# Patient Record
Sex: Female | Born: 1935 | Race: White | Hispanic: No | Smoking: Current every day smoker
Health system: Southern US, Community
[De-identification: ages and names within clinical notes are randomized; demographics above are authoritative.]

## PROBLEM LIST (undated history)

## (undated) DIAGNOSIS — F329 Major depressive disorder, single episode, unspecified: Secondary | ICD-10-CM

## (undated) DIAGNOSIS — F32A Depression, unspecified: Secondary | ICD-10-CM

## (undated) DIAGNOSIS — F419 Anxiety disorder, unspecified: Secondary | ICD-10-CM

## (undated) DIAGNOSIS — J449 Chronic obstructive pulmonary disease, unspecified: Secondary | ICD-10-CM

## (undated) DIAGNOSIS — I10 Essential (primary) hypertension: Secondary | ICD-10-CM

---

## 2013-11-05 ENCOUNTER — Inpatient Hospital Stay (HOSPITAL_COMMUNITY): Payer: Medicare Other

## 2013-11-05 ENCOUNTER — Encounter (HOSPITAL_COMMUNITY): Payer: Self-pay | Admitting: *Deleted

## 2013-11-05 ENCOUNTER — Inpatient Hospital Stay (HOSPITAL_COMMUNITY)
Admission: AD | Admit: 2013-11-05 | Discharge: 2013-11-09 | DRG: 470 | Disposition: A | Discharge status: Skilled Nursing Facility | Payer: Medicare Other | Source: Other Acute Inpatient Hospital | Attending: Internal Medicine | Admitting: Internal Medicine

## 2013-11-05 DIAGNOSIS — S72143A Displaced intertrochanteric fracture of unspecified femur, initial encounter for closed fracture: Principal | ICD-10-CM | POA: Diagnosis present

## 2013-11-05 DIAGNOSIS — F411 Generalized anxiety disorder: Secondary | ICD-10-CM

## 2013-11-05 DIAGNOSIS — S72001A Fracture of unspecified part of neck of right femur, initial encounter for closed fracture: Secondary | ICD-10-CM | POA: Diagnosis present

## 2013-11-05 DIAGNOSIS — I10 Essential (primary) hypertension: Secondary | ICD-10-CM

## 2013-11-05 DIAGNOSIS — R911 Solitary pulmonary nodule: Secondary | ICD-10-CM | POA: Diagnosis present

## 2013-11-05 DIAGNOSIS — F17200 Nicotine dependence, unspecified, uncomplicated: Secondary | ICD-10-CM | POA: Diagnosis present

## 2013-11-05 DIAGNOSIS — D62 Acute posthemorrhagic anemia: Secondary | ICD-10-CM | POA: Diagnosis not present

## 2013-11-05 DIAGNOSIS — F32A Depression, unspecified: Secondary | ICD-10-CM

## 2013-11-05 DIAGNOSIS — R5082 Postprocedural fever: Secondary | ICD-10-CM | POA: Diagnosis present

## 2013-11-05 DIAGNOSIS — W19XXXA Unspecified fall, initial encounter: Secondary | ICD-10-CM | POA: Diagnosis present

## 2013-11-05 DIAGNOSIS — Y92009 Unspecified place in unspecified non-institutional (private) residence as the place of occurrence of the external cause: Secondary | ICD-10-CM

## 2013-11-05 DIAGNOSIS — F329 Major depressive disorder, single episode, unspecified: Secondary | ICD-10-CM

## 2013-11-05 DIAGNOSIS — F3289 Other specified depressive episodes: Secondary | ICD-10-CM

## 2013-11-05 DIAGNOSIS — E039 Hypothyroidism, unspecified: Secondary | ICD-10-CM

## 2013-11-05 DIAGNOSIS — J449 Chronic obstructive pulmonary disease, unspecified: Secondary | ICD-10-CM | POA: Diagnosis present

## 2013-11-05 DIAGNOSIS — S72009A Fracture of unspecified part of neck of unspecified femur, initial encounter for closed fracture: Secondary | ICD-10-CM | POA: Diagnosis present

## 2013-11-05 DIAGNOSIS — D696 Thrombocytopenia, unspecified: Secondary | ICD-10-CM | POA: Diagnosis present

## 2013-11-05 DIAGNOSIS — J4489 Other specified chronic obstructive pulmonary disease: Secondary | ICD-10-CM | POA: Diagnosis present

## 2013-11-05 DIAGNOSIS — S72141A Displaced intertrochanteric fracture of right femur, initial encounter for closed fracture: Secondary | ICD-10-CM

## 2013-11-05 DIAGNOSIS — J441 Chronic obstructive pulmonary disease with (acute) exacerbation: Secondary | ICD-10-CM

## 2013-11-05 HISTORY — DX: Essential (primary) hypertension: I10

## 2013-11-05 HISTORY — DX: Anxiety disorder, unspecified: F41.9

## 2013-11-05 HISTORY — DX: Chronic obstructive pulmonary disease, unspecified: J44.9

## 2013-11-05 HISTORY — DX: Depression, unspecified: F32.A

## 2013-11-05 HISTORY — DX: Major depressive disorder, single episode, unspecified: F32.9

## 2013-11-05 LAB — COMPREHENSIVE METABOLIC PANEL
ALBUMIN: 3.7 g/dL (ref 3.5–5.2)
ALT: 24 U/L (ref 0–35)
AST: 40 U/L — AB (ref 0–37)
Alkaline Phosphatase: 97 U/L (ref 39–117)
BILIRUBIN TOTAL: 0.5 mg/dL (ref 0.3–1.2)
BUN: 8 mg/dL (ref 6–23)
CHLORIDE: 102 meq/L (ref 96–112)
CO2: 26 mEq/L (ref 19–32)
Calcium: 9.2 mg/dL (ref 8.4–10.5)
Creatinine, Ser: 1.05 mg/dL (ref 0.50–1.10)
GFR calc Af Amer: 56 mL/min — ABNORMAL LOW (ref >90)
GFR calc non Af Amer: 48 mL/min — ABNORMAL LOW (ref >90)
Glucose, Bld: 117 mg/dL — ABNORMAL HIGH (ref 70–99)
POTASSIUM: 4.1 meq/L (ref 3.7–5.3)
Sodium: 140 mEq/L (ref 137–147)
Total Protein: 6.7 g/dL (ref 6.0–8.3)

## 2013-11-05 LAB — CBC WITH DIFFERENTIAL/PLATELET
BASOS ABS: 0 10*3/uL (ref 0.0–0.1)
Basophils Relative: 0 % (ref 0–1)
Eosinophils Absolute: 0 10*3/uL (ref 0.0–0.7)
Eosinophils Relative: 0 % (ref 0–5)
HEMATOCRIT: 43 % (ref 36.0–46.0)
Hemoglobin: 14.2 g/dL (ref 12.0–15.0)
Lymphocytes Relative: 11 % — ABNORMAL LOW (ref 12–46)
Lymphs Abs: 0.6 10*3/uL — ABNORMAL LOW (ref 0.7–4.0)
MCH: 30.4 pg (ref 26.0–34.0)
MCHC: 33 g/dL (ref 30.0–36.0)
MCV: 92.1 fL (ref 78.0–100.0)
Monocytes Absolute: 0.1 10*3/uL (ref 0.1–1.0)
Monocytes Relative: 3 % (ref 3–12)
NEUTROS ABS: 4.4 10*3/uL (ref 1.7–7.7)
Neutrophils Relative %: 86 % — ABNORMAL HIGH (ref 43–77)
PLATELETS: 120 10*3/uL — AB (ref 150–400)
RBC: 4.67 MIL/uL (ref 3.87–5.11)
RDW: 14.2 % (ref 11.5–15.5)
WBC: 5.1 10*3/uL (ref 4.0–10.5)

## 2013-11-05 LAB — APTT: APTT: 30 s (ref 24–37)

## 2013-11-05 LAB — BLOOD GAS, ARTERIAL
Acid-Base Excess: 1.5 mmol/L (ref 0.0–2.0)
Bicarbonate: 25.5 mEq/L — ABNORMAL HIGH (ref 20.0–24.0)
Drawn by: 105521
FIO2: 0.21 %
O2 Saturation: 92.5 %
PATIENT TEMPERATURE: 98.6
PH ART: 7.424 (ref 7.350–7.450)
TCO2: 26.7 mmol/L (ref 0–100)
pCO2 arterial: 39.6 mmHg (ref 35.0–45.0)
pO2, Arterial: 62.1 mmHg — ABNORMAL LOW (ref 80.0–100.0)

## 2013-11-05 LAB — PHOSPHORUS: PHOSPHORUS: 2.5 mg/dL (ref 2.3–4.6)

## 2013-11-05 LAB — TSH: TSH: 0.934 u[IU]/mL (ref 0.350–4.500)

## 2013-11-05 LAB — MAGNESIUM: Magnesium: 1.8 mg/dL (ref 1.5–2.5)

## 2013-11-05 LAB — PROTIME-INR
INR: 0.94 (ref 0.00–1.49)
PROTHROMBIN TIME: 12.4 s (ref 11.6–15.2)

## 2013-11-05 MED ORDER — FOLIC ACID 1 MG PO TABS
1.0000 mg | ORAL_TABLET | Freq: Every day | ORAL | Status: DC
  Administered 2013-11-07 – 2013-11-09 (×3): 1 mg via ORAL
  Filled 2013-11-05 (×5): qty 1

## 2013-11-05 MED ORDER — ONDANSETRON HCL 4 MG PO TABS: 4.0000 mg | ORAL_TABLET | Freq: Four times a day (QID) | ORAL | Status: DC | PRN

## 2013-11-05 MED ORDER — HYDROCODONE-ACETAMINOPHEN 5-325 MG PO TABS
1.0000 | ORAL_TABLET | ORAL | Status: DC | PRN
  Administered 2013-11-05 – 2013-11-06 (×4): 1 via ORAL
  Administered 2013-11-06: 2 via ORAL
  Administered 2013-11-06: 1 via ORAL
  Administered 2013-11-07 (×2): 2 via ORAL
  Administered 2013-11-07: 1 via ORAL
  Administered 2013-11-07: 2 via ORAL
  Administered 2013-11-08: 1 via ORAL
  Filled 2013-11-05: qty 2
  Filled 2013-11-05: qty 1
  Filled 2013-11-05: qty 2
  Filled 2013-11-05 (×6): qty 1
  Filled 2013-11-05: qty 2

## 2013-11-05 MED ORDER — SODIUM CHLORIDE 0.9 % IV SOLN: 250.0000 mL | INTRAVENOUS | Status: DC | PRN

## 2013-11-05 MED ORDER — ACETAMINOPHEN 650 MG RE SUPP: 650.0000 mg | Freq: Four times a day (QID) | RECTAL | Status: DC | PRN

## 2013-11-05 MED ORDER — MORPHINE SULFATE 2 MG/ML IJ SOLN
2.0000 mg | INTRAMUSCULAR | Status: DC | PRN
  Administered 2013-11-06 – 2013-11-07 (×2): 2 mg via INTRAVENOUS
  Filled 2013-11-05 (×2): qty 1

## 2013-11-05 MED ORDER — LEVOTHYROXINE SODIUM 88 MCG PO TABS
88.0000 ug | ORAL_TABLET | Freq: Every day | ORAL | Status: DC
  Administered 2013-11-07 – 2013-11-09 (×3): 88 ug via ORAL
  Filled 2013-11-05 (×5): qty 1

## 2013-11-05 MED ORDER — ADULT MULTIVITAMIN W/MINERALS CH
1.0000 | ORAL_TABLET | Freq: Every day | ORAL | Status: DC
  Administered 2013-11-07 – 2013-11-09 (×3): 1 via ORAL
  Filled 2013-11-05 (×5): qty 1

## 2013-11-05 MED ORDER — ACETAMINOPHEN 325 MG PO TABS
650.0000 mg | ORAL_TABLET | Freq: Four times a day (QID) | ORAL | Status: DC | PRN
  Administered 2013-11-08: 650 mg via ORAL
  Filled 2013-11-05: qty 2

## 2013-11-05 MED ORDER — SODIUM CHLORIDE 0.9 % IJ SOLN: 3.0000 mL | INTRAMUSCULAR | Status: DC | PRN

## 2013-11-05 MED ORDER — SODIUM CHLORIDE 0.9 % IJ SOLN
3.0000 mL | Freq: Two times a day (BID) | INTRAMUSCULAR | Status: DC
  Administered 2013-11-06: 3 mL via INTRAVENOUS

## 2013-11-05 MED ORDER — SERTRALINE HCL 50 MG PO TABS
150.0000 mg | ORAL_TABLET | Freq: Every day | ORAL | Status: DC
  Administered 2013-11-07 – 2013-11-09 (×3): 150 mg via ORAL
  Filled 2013-11-05 (×4): qty 1

## 2013-11-05 MED ORDER — ATORVASTATIN CALCIUM 80 MG PO TABS
80.0000 mg | ORAL_TABLET | Freq: Every day | ORAL | Status: DC
  Administered 2013-11-05 – 2013-11-08 (×4): 80 mg via ORAL
  Filled 2013-11-05 (×5): qty 1

## 2013-11-05 MED ORDER — ONDANSETRON HCL 4 MG/2ML IJ SOLN
4.0000 mg | Freq: Four times a day (QID) | INTRAMUSCULAR | Status: DC | PRN
  Administered 2013-11-06: 4 mg via INTRAVENOUS
  Filled 2013-11-05: qty 2

## 2013-11-05 MED ORDER — ALUM & MAG HYDROXIDE-SIMETH 200-200-20 MG/5ML PO SUSP: 30.0000 mL | Freq: Four times a day (QID) | ORAL | Status: DC | PRN

## 2013-11-05 MED ORDER — VITAMIN B-1 100 MG PO TABS
100.0000 mg | ORAL_TABLET | Freq: Every day | ORAL | Status: DC
  Administered 2013-11-07 – 2013-11-09 (×3): 100 mg via ORAL
  Filled 2013-11-05 (×5): qty 1

## 2013-11-05 MED ORDER — CLONAZEPAM 1 MG PO TABS
1.0000 mg | ORAL_TABLET | Freq: Three times a day (TID) | ORAL | Status: DC
  Administered 2013-11-05 – 2013-11-09 (×9): 1 mg via ORAL
  Filled 2013-11-05 (×9): qty 1

## 2013-11-05 MED ORDER — LISINOPRIL 10 MG PO TABS
30.0000 mg | ORAL_TABLET | Freq: Every day | ORAL | Status: DC
  Filled 2013-11-05: qty 1

## 2013-11-05 NOTE — H&P (Signed)
[facility] History and Physical  Kara Mcdowell ZOX:[id number] DOB: 1929-08-06 DOA: 11/05/2013  Referring physician: [facility] ED PCP: No primary provider on file.   Chief Complaint: Golden Circle and Broke Hip  HPI: Kara Mcdowell is a 80 y.o. female who is fairly active states that she was at home after breakfast went outdoors and was trying to swat some bumble bees, She states that her foot got caught on the concrete and she fell on her right side. She states that her right foot was intially numb after the fall but now is better. She states that she had no dizziness and she had no headache. She denies any loss of consciousness. She states that there was no chest pain noted. She states that she had no SOB noted. She has no edema of her legs, She denies any history of prior heart disease other than hypertension.    Review of Systems:  Constitutional:  No weight loss, night sweats, Fevers, chills, fatigue.  HEENT:  No headaches,   Cardio-vascular:  No chest pain, Orthopnea, PND, dizziness, palpitations  GI:  No heartburn, indigestion, abdominal pain, nausea, vomiting, diarrhea  Resp:  No shortness of breath with exertion or at rest. No excess mucus, no productive cough, No non-productive cough, No coughing up of blood.No change in color of mucus.No wheezing  Skin:  no rash or lesions.  GU:  no dysuria, change in color of urine, no urgency or frequency. No flank pain.  Musculoskeletal:  No joint pain or swelling. No decreased range of motion. No back pain.  Psych:  No change in mood or affect. ++depression or anxiety. No memory loss.   Past Medical History  Diagnosis Date  . Hypertension   . Anxiety   . Depression   . COPD (chronic obstructive pulmonary disease)    History reviewed. No pertinent past surgical history. Social History:  reports that she has been smoking Cigarettes.  She has been smoking about 0.50 packs per day. She does not have any smokeless tobacco  history on file. Her alcohol and drug histories are not on file.  No Known Allergies  No family history on file.   Prior to Admission medications   Not on File   Physical Exam: Filed Vitals:   11/05/13 1614  BP: 147/74  Pulse: 84  Temp: 98.2 F (36.8 C)  Resp: 18    BP 147/74  Pulse 84  Temp(Src) 98.2 F (36.8 C)  Resp 18  SpO2 91%  General:  Appears calm and comfortable Eyes: PERRL, normal lids, irises & conjunctiva ENT: grossly normal hearing, lips & tongue Neck: no LAD, masses or thyromegaly Cardiovascular: RRR, no m/r/g. No LE edema. Respiratory: CTA bilaterally, no w/r/r. Normal respiratory effort. Abdomen: soft, ntnd Skin: no rash or induration seen on limited exam Musculoskeletal: grossly normal tone BUE/BLE, Right LE external rotation and shortened ++pain Psychiatric: grossly normal mood and affect, speech fluent and appropriate Neurologic: grossly non-focal.          Labs on Admission:  Basic Metabolic Panel: No results found for this basename: NA, K, CL, CO2, GLUCOSE, BUN, CREATININE, CALCIUM, MG, PHOS,  in the last 168 hours Liver Function Tests: No results found for this basename: AST, ALT, ALKPHOS, BILITOT, PROT, ALBUMIN,  in the last 168 hours No results found for this basename: LIPASE, AMYLASE,  in the last 168 hours No results found for this basename: AMMONIA,  in the last 168 hours CBC: No results found for this basename: WBC, NEUTROABS, HGB, HCT,  MCV, PLT,  in the last 168 hours Cardiac Enzymes: No results found for this basename: CKTOTAL, CKMB, CKMBINDEX, TROPONINI,  in the last 168 hours  BNP (last 3 results) No results found for this basename: PROBNP,  in the last 8760 hours CBG: No results found for this basename: GLUCAP,  in the last 168 hours  Radiological Exams on Admission: No results found.  EKG: ordered  Assessment/Plan Principal Problem:   Intertrochanteric fracture of right hip Active Problems:   Essential hypertension,  benign   Anxiety state, unspecified   Depression   COPD exacerbation   Hip fracture, right   Unspecified hypothyroidism   1. Fractured hip -patient will be admitted to medicine service with ortho consult-spoke with Dr Marlou Sa -Will get admit labs and pre-op ECG -will also get ABG -Risk for surgery is Moderately Increased  2. Hypertension -will continue with home medications -she does not know what her medications are will get a pharmacy consultation -currently pressure is under control  3. COPD -she is a smoker and still smokes -will get a ABG done now -bedside spirometry -counseled on smoking cessation  4. Depression/Anxiety -she states that she takes clonazepam and an antidepressant will get a pharmacy consult to evaluate  5. Hypothyroid -check TSH -need proper dosage of her thyroid medications  Code Status: Full Code (must indicate code status--if unknown or must be presumed, indicate so) Family Communication: No family (indicate person spoken with, if applicable, with phone number if by telephone) Disposition Plan: Rehab (indicate anticipated LOS)  Time spent: 54min  Saadat A Khan [facility] Pager [phone]

## 2013-11-05 NOTE — Consult Note (Signed)
Reason for Consult:right hip pain Referring Physician: Dr Massie Kluver is an 80 y.o. female.  HPI: Ms. Stratmann is an 80 year old immature female who lives at home who sustained a mechanical fall today. Denies any loss of consciousness or any other orthopedic complaints. She is unable to bear weight on the right hip. She presents now for operative management after expiration risk and benefits. She was transferred for [facility] hospital Past Medical History  Diagnosis Date  . Hypertension   . Anxiety   . Depression   . COPD (chronic obstructive pulmonary disease)     History reviewed. No pertinent past surgical history.  No family history on file.  Social History:  reports that she has been smoking Cigarettes.  She has been smoking about 0.50 packs per day. She does not have any smokeless tobacco history on file. Her alcohol and drug histories are not on file.  Allergies: No Known Allergies  Medications: I have reviewed the patient's current medications.  Results for orders placed during the hospital encounter of 11/05/13 (from the past 48 hour(s))  BLOOD GAS, ARTERIAL     Status: Abnormal   Collection Time    11/05/13  6:20 PM      Result Value Ref Range   FIO2 0.21     pH, Arterial 7.424  7.350 - 7.450   pCO2 arterial 39.6  35.0 - 45.0 mmHg   pO2, Arterial 62.1 (*) 80.0 - 100.0 mmHg   Bicarbonate 25.5 (*) 20.0 - 24.0 mEq/L   TCO2 26.7  0 - 100 mmol/L   Acid-Base Excess 1.5  0.0 - 2.0 mmol/L   O2 Saturation 92.5     Patient temperature 98.6     Collection site RIGHT RADIAL     Drawn by [id number]     Sample type ARTERIAL DRAW     Allens test (pass/fail) PASS  PASS  PROTIME-INR     Status: None   Collection Time    11/05/13  6:33 PM      Result Value Ref Range   Prothrombin Time 12.4  11.6 - 15.2 seconds   INR 0.94  0.00 - 1.49  APTT     Status: None   Collection Time    11/05/13  6:33 PM      Result Value Ref Range   aPTT 30  24 - 37 seconds  CBC WITH DIFFERENTIAL      Status: Abnormal   Collection Time    11/05/13  6:33 PM      Result Value Ref Range   WBC 5.1  4.0 - 10.5 K/uL   RBC 4.67  3.87 - 5.11 MIL/uL   Hemoglobin 14.2  12.0 - 15.0 g/dL   HCT 43.0  36.0 - 46.0 %   MCV 92.1  78.0 - 100.0 fL   MCH 30.4  26.0 - 34.0 pg   MCHC 33.0  30.0 - 36.0 g/dL   RDW 14.2  11.5 - 15.5 %   Platelets 120 (*) 150 - 400 K/uL   Neutrophils Relative % 86 (*) 43 - 77 %   Neutro Abs 4.4  1.7 - 7.7 K/uL   Lymphocytes Relative 11 (*) 12 - 46 %   Lymphs Abs 0.6 (*) 0.7 - 4.0 K/uL   Monocytes Relative 3  3 - 12 %   Monocytes Absolute 0.1  0.1 - 1.0 K/uL   Eosinophils Relative 0  0 - 5 %   Eosinophils Absolute 0.0  0.0 - 0.7 K/uL  Basophils Relative 0  0 - 1 %   Basophils Absolute 0.0  0.0 - 0.1 K/uL  TSH     Status: None   Collection Time    11/05/13  6:33 PM      Result Value Ref Range   TSH 0.934  0.350 - 4.500 uIU/mL   Comment: Please note change in reference range.  COMPREHENSIVE METABOLIC PANEL     Status: Abnormal   Collection Time    11/05/13  6:33 PM      Result Value Ref Range   Sodium 140  137 - 147 mEq/L   Potassium 4.1  3.7 - 5.3 mEq/L   Chloride 102  96 - 112 mEq/L   CO2 26  19 - 32 mEq/L   Glucose, Bld 117 (*) 70 - 99 mg/dL   BUN 8  6 - 23 mg/dL   Creatinine, Ser 1.05  0.50 - 1.10 mg/dL   Calcium 9.2  8.4 - 10.5 mg/dL   Total Protein 6.7  6.0 - 8.3 g/dL   Albumin 3.7  3.5 - 5.2 g/dL   AST 40 (*) 0 - 37 U/L   ALT 24  0 - 35 U/L   Alkaline Phosphatase 97  39 - 117 U/L   Total Bilirubin 0.5  0.3 - 1.2 mg/dL   GFR calc non Af Amer 48 (*) >90 mL/min   GFR calc Af Amer 56 (*) >90 mL/min   Comment: (NOTE)     The eGFR has been calculated using the CKD EPI equation.     This calculation has not been validated in all clinical situations.     eGFR's persistently <90 mL/min signify possible Chronic Kidney     Disease.  MAGNESIUM     Status: None   Collection Time    11/05/13  6:33 PM      Result Value Ref Range   Magnesium 1.8  1.5 - 2.5  mg/dL  PHOSPHORUS     Status: None   Collection Time    11/05/13  6:33 PM      Result Value Ref Range   Phosphorus 2.5  2.3 - 4.6 mg/dL    No results found.  Review of Systems  Constitutional: Negative.   HENT: Negative.   Eyes: Negative.   Respiratory: Negative.   Cardiovascular: Negative.   Gastrointestinal: Negative.   Genitourinary: Negative.   Musculoskeletal: Positive for joint pain.  Skin: Negative.   Neurological: Negative.   Endo/Heme/Allergies: Negative.   Psychiatric/Behavioral: Negative.    Blood pressure 147/74, pulse 84, temperature 98.2 F (36.8 C), resp. rate 18, SpO2 91.00%. Physical Exam  Constitutional: She appears well-developed.  HENT:  Head: Normocephalic.  Eyes: Pupils are equal, round, and reactive to light.  Neck: Normal range of motion.  Cardiovascular: Normal rate.   Respiratory: Effort normal.  Neurological: She is alert.  Skin: Skin is warm.  Psychiatric: She has a normal mood and affect.   examination of the right hip demonstrates pain with range of motion pedal pulses intact ankle dorsiflexion plantarflexion intact on the right-hand side sensation intact no real issues with the left hip knee or ankle. Bilateral upper extremity range of motion is intact.  Assessment/Plan: Impression is right hip fracture in an ambulatory 80 year old patient. Plan right hip hemiarthroplasty. Risk and benefits discussed with the patient couldn't not limited to infection nerve vessel damage limb length inequality dislocation as well as potential for further surgery there is no preop osteoarthritis in the right hip. Patient extends  the risk and benefits and wish to proceed with intervention all questions answered we'll plan to do this either tonight or tomorrow morning depending on the or availability.  Tonna Corner Adaiah Morken 11/05/2013, 7:31 PM

## 2013-11-06 ENCOUNTER — Encounter (HOSPITAL_COMMUNITY): Payer: Self-pay | Admitting: Critical Care Medicine

## 2013-11-06 ENCOUNTER — Inpatient Hospital Stay (HOSPITAL_COMMUNITY): Payer: Medicare Other | Admitting: Critical Care Medicine

## 2013-11-06 ENCOUNTER — Inpatient Hospital Stay (HOSPITAL_COMMUNITY): Payer: Medicare Other

## 2013-11-06 ENCOUNTER — Encounter (HOSPITAL_COMMUNITY)
Admission: AD | Disposition: A | Discharge status: Skilled Nursing Facility | Payer: Self-pay | Source: Other Acute Inpatient Hospital | Attending: Internal Medicine

## 2013-11-06 ENCOUNTER — Encounter (HOSPITAL_COMMUNITY): Payer: Medicare Other | Admitting: Critical Care Medicine

## 2013-11-06 DIAGNOSIS — J441 Chronic obstructive pulmonary disease with (acute) exacerbation: Secondary | ICD-10-CM

## 2013-11-06 DIAGNOSIS — F411 Generalized anxiety disorder: Secondary | ICD-10-CM

## 2013-11-06 DIAGNOSIS — S72143A Displaced intertrochanteric fracture of unspecified femur, initial encounter for closed fracture: Principal | ICD-10-CM

## 2013-11-06 DIAGNOSIS — S72009A Fracture of unspecified part of neck of unspecified femur, initial encounter for closed fracture: Secondary | ICD-10-CM | POA: Diagnosis present

## 2013-11-06 HISTORY — PX: HIP ARTHROPLASTY: SHX981

## 2013-11-06 LAB — CBC
HCT: 39.7 % (ref 36.0–46.0)
Hemoglobin: 13 g/dL (ref 12.0–15.0)
MCH: 29.8 pg (ref 26.0–34.0)
MCHC: 32.7 g/dL (ref 30.0–36.0)
MCV: 91.1 fL (ref 78.0–100.0)
PLATELETS: 121 10*3/uL — AB (ref 150–400)
RBC: 4.36 MIL/uL (ref 3.87–5.11)
RDW: 14.4 % (ref 11.5–15.5)
WBC: 3.9 10*3/uL — ABNORMAL LOW (ref 4.0–10.5)

## 2013-11-06 LAB — COMPREHENSIVE METABOLIC PANEL
ALK PHOS: 84 U/L (ref 39–117)
ALT: 22 U/L (ref 0–35)
AST: 36 U/L (ref 0–37)
Albumin: 3.2 g/dL — ABNORMAL LOW (ref 3.5–5.2)
BILIRUBIN TOTAL: 0.5 mg/dL (ref 0.3–1.2)
BUN: 13 mg/dL (ref 6–23)
CHLORIDE: 100 meq/L (ref 96–112)
CO2: 25 mEq/L (ref 19–32)
Calcium: 8.5 mg/dL (ref 8.4–10.5)
Creatinine, Ser: 1.1 mg/dL (ref 0.50–1.10)
GFR calc Af Amer: 53 mL/min — ABNORMAL LOW (ref >90)
GFR, EST NON AFRICAN AMERICAN: 46 mL/min — AB (ref >90)
Glucose, Bld: 102 mg/dL — ABNORMAL HIGH (ref 70–99)
POTASSIUM: 4.1 meq/L (ref 3.7–5.3)
Sodium: 134 mEq/L — ABNORMAL LOW (ref 137–147)
Total Protein: 6.1 g/dL (ref 6.0–8.3)

## 2013-11-06 LAB — SURGICAL PCR SCREEN
MRSA, PCR: NEGATIVE
Staphylococcus aureus: POSITIVE — AB

## 2013-11-06 SURGERY — HEMIARTHROPLASTY, HIP, DIRECT ANTERIOR APPROACH, FOR FRACTURE
Anesthesia: General | Site: Hip | Laterality: Right

## 2013-11-06 MED ORDER — NEOSTIGMINE METHYLSULFATE 10 MG/10ML IV SOLN
INTRAVENOUS | Status: AC
  Filled 2013-11-06: qty 1

## 2013-11-06 MED ORDER — ONDANSETRON HCL 4 MG/2ML IJ SOLN: 4.0000 mg | Freq: Four times a day (QID) | INTRAMUSCULAR | Status: DC | PRN

## 2013-11-06 MED ORDER — MIDAZOLAM HCL 2 MG/2ML IJ SOLN
INTRAMUSCULAR | Status: AC
Start: 2013-11-06 — End: 2013-11-06
  Filled 2013-11-06: qty 2

## 2013-11-06 MED ORDER — LISINOPRIL 20 MG PO TABS
30.0000 mg | ORAL_TABLET | Freq: Every day | ORAL | Status: DC
  Administered 2013-11-06: 30 mg via ORAL
  Filled 2013-11-06 (×2): qty 1

## 2013-11-06 MED ORDER — FENTANYL CITRATE 0.05 MG/ML IJ SOLN
25.0000 ug | INTRAMUSCULAR | Status: DC | PRN
  Administered 2013-11-06: 25 ug via INTRAVENOUS

## 2013-11-06 MED ORDER — ATORVASTATIN CALCIUM 80 MG PO TABS: 80.0000 mg | ORAL_TABLET | Freq: Every day | ORAL | Status: DC

## 2013-11-06 MED ORDER — EPHEDRINE SULFATE 50 MG/ML IJ SOLN
INTRAMUSCULAR | Status: DC | PRN
  Administered 2013-11-06: 5 mg via INTRAVENOUS
  Administered 2013-11-06: 10 mg via INTRAVENOUS

## 2013-11-06 MED ORDER — COUMADIN BOOK
Freq: Once | Status: DC
  Filled 2013-11-06: qty 1

## 2013-11-06 MED ORDER — MIDAZOLAM HCL 5 MG/5ML IJ SOLN
INTRAMUSCULAR | Status: DC | PRN
  Administered 2013-11-06: 1 mg via INTRAVENOUS

## 2013-11-06 MED ORDER — ACETAMINOPHEN 650 MG RE SUPP
650.0000 mg | Freq: Four times a day (QID) | RECTAL | Status: DC | PRN
Start: 2013-11-06 — End: 2013-11-06

## 2013-11-06 MED ORDER — WARFARIN - PHARMACIST DOSING INPATIENT: Freq: Every day | Status: DC

## 2013-11-06 MED ORDER — GLYCOPYRROLATE 0.2 MG/ML IJ SOLN
INTRAMUSCULAR | Status: AC
  Filled 2013-11-06: qty 2

## 2013-11-06 MED ORDER — METOCLOPRAMIDE HCL 10 MG PO TABS: 5.0000 mg | ORAL_TABLET | Freq: Three times a day (TID) | ORAL | Status: DC | PRN

## 2013-11-06 MED ORDER — CEFAZOLIN SODIUM-DEXTROSE 2-3 GM-% IV SOLR
INTRAVENOUS | Status: AC
  Filled 2013-11-06: qty 50

## 2013-11-06 MED ORDER — WARFARIN VIDEO: Freq: Once | Status: DC

## 2013-11-06 MED ORDER — ONDANSETRON HCL 4 MG/2ML IJ SOLN
INTRAMUSCULAR | Status: DC | PRN
  Administered 2013-11-06: 4 mg via INTRAVENOUS

## 2013-11-06 MED ORDER — FENTANYL CITRATE 0.05 MG/ML IJ SOLN
INTRAMUSCULAR | Status: AC
  Administered 2013-11-06: 25 ug via INTRAVENOUS
  Filled 2013-11-06: qty 2

## 2013-11-06 MED ORDER — HYDROCODONE-ACETAMINOPHEN 5-325 MG PO TABS: 1.0000 | ORAL_TABLET | Freq: Four times a day (QID) | ORAL | Status: DC | PRN

## 2013-11-06 MED ORDER — MORPHINE SULFATE 2 MG/ML IJ SOLN: 0.5000 mg | INTRAMUSCULAR | Status: DC | PRN

## 2013-11-06 MED ORDER — CEFAZOLIN SODIUM-DEXTROSE 2-3 GM-% IV SOLR
2.0000 g | Freq: Four times a day (QID) | INTRAVENOUS | Status: AC
  Administered 2013-11-06 – 2013-11-07 (×2): 2 g via INTRAVENOUS
  Filled 2013-11-06 (×2): qty 50

## 2013-11-06 MED ORDER — ROCURONIUM BROMIDE 50 MG/5ML IV SOLN
INTRAVENOUS | Status: AC
  Filled 2013-11-06: qty 1

## 2013-11-06 MED ORDER — PHENYLEPHRINE HCL 10 MG/ML IJ SOLN
INTRAMUSCULAR | Status: DC | PRN
  Administered 2013-11-06 (×3): 80 ug via INTRAVENOUS

## 2013-11-06 MED ORDER — WARFARIN SODIUM 5 MG PO TABS
5.0000 mg | ORAL_TABLET | Freq: Once | ORAL | Status: AC
  Administered 2013-11-06: 5 mg via ORAL
  Filled 2013-11-06: qty 1

## 2013-11-06 MED ORDER — ONDANSETRON HCL 4 MG PO TABS: 4.0000 mg | ORAL_TABLET | Freq: Four times a day (QID) | ORAL | Status: DC | PRN

## 2013-11-06 MED ORDER — FENTANYL CITRATE 0.05 MG/ML IJ SOLN
INTRAMUSCULAR | Status: AC
  Filled 2013-11-06: qty 5

## 2013-11-06 MED ORDER — PROPOFOL 10 MG/ML IV BOLUS
INTRAVENOUS | Status: AC
  Filled 2013-11-06: qty 20

## 2013-11-06 MED ORDER — HYDROCODONE-ACETAMINOPHEN 5-325 MG PO TABS
ORAL_TABLET | ORAL | Status: AC
  Administered 2013-11-06: 2 via ORAL
  Filled 2013-11-06: qty 2

## 2013-11-06 MED ORDER — DROPERIDOL 2.5 MG/ML IJ SOLN
0.6250 mg | INTRAMUSCULAR | Status: DC | PRN
  Filled 2013-11-06: qty 0.25

## 2013-11-06 MED ORDER — LEVOTHYROXINE SODIUM 88 MCG PO TABS: 88.0000 ug | ORAL_TABLET | Freq: Every day | ORAL | Status: DC

## 2013-11-06 MED ORDER — MENTHOL 3 MG MT LOZG: 1.0000 | LOZENGE | OROMUCOSAL | Status: DC | PRN

## 2013-11-06 MED ORDER — CEFAZOLIN SODIUM-DEXTROSE 2-3 GM-% IV SOLR
2.0000 g | Freq: Once | INTRAVENOUS | Status: AC
  Administered 2013-11-06: 2 g via INTRAVENOUS
  Filled 2013-11-06: qty 50

## 2013-11-06 MED ORDER — FENTANYL CITRATE 0.05 MG/ML IJ SOLN
INTRAMUSCULAR | Status: DC | PRN
  Administered 2013-11-06: 100 ug via INTRAVENOUS
  Administered 2013-11-06: 50 ug via INTRAVENOUS
  Administered 2013-11-06: 100 ug via INTRAVENOUS

## 2013-11-06 MED ORDER — LACTATED RINGERS IV SOLN
INTRAVENOUS | Status: DC | PRN
  Administered 2013-11-06 (×2): via INTRAVENOUS

## 2013-11-06 MED ORDER — ONDANSETRON HCL 4 MG/2ML IJ SOLN
INTRAMUSCULAR | Status: AC
  Filled 2013-11-06: qty 2

## 2013-11-06 MED ORDER — POTASSIUM CHLORIDE IN NACL 20-0.9 MEQ/L-% IV SOLN
INTRAVENOUS | Status: DC
  Filled 2013-11-06 (×3): qty 1000

## 2013-11-06 MED ORDER — LIDOCAINE HCL (CARDIAC) 20 MG/ML IV SOLN
INTRAVENOUS | Status: AC
  Filled 2013-11-06: qty 5

## 2013-11-06 MED ORDER — METOCLOPRAMIDE HCL 5 MG/ML IJ SOLN
5.0000 mg | Freq: Three times a day (TID) | INTRAMUSCULAR | Status: DC | PRN
  Administered 2013-11-06: 10 mg via INTRAVENOUS
  Filled 2013-11-06: qty 2

## 2013-11-06 MED ORDER — LIDOCAINE HCL (CARDIAC) 20 MG/ML IV SOLN
INTRAVENOUS | Status: DC | PRN
  Administered 2013-11-06: 20 mg via INTRAVENOUS

## 2013-11-06 MED ORDER — ARTIFICIAL TEARS OP OINT
TOPICAL_OINTMENT | OPHTHALMIC | Status: DC | PRN
  Administered 2013-11-06: 1 via OPHTHALMIC

## 2013-11-06 MED ORDER — PHENOL 1.4 % MT LIQD: 1.0000 | OROMUCOSAL | Status: DC | PRN

## 2013-11-06 MED ORDER — ROCURONIUM BROMIDE 100 MG/10ML IV SOLN
INTRAVENOUS | Status: DC | PRN
  Administered 2013-11-06: 35 mg via INTRAVENOUS

## 2013-11-06 MED ORDER — PHENYLEPHRINE 40 MCG/ML (10ML) SYRINGE FOR IV PUSH (FOR BLOOD PRESSURE SUPPORT)
PREFILLED_SYRINGE | INTRAVENOUS | Status: AC
  Filled 2013-11-06: qty 10

## 2013-11-06 MED ORDER — PHENYLEPHRINE HCL 10 MG/ML IJ SOLN
10.0000 mg | INTRAVENOUS | Status: DC | PRN
  Administered 2013-11-06: 25 ug/min via INTRAVENOUS

## 2013-11-06 MED ORDER — PROPOFOL 10 MG/ML IV BOLUS
INTRAVENOUS | Status: DC | PRN
  Administered 2013-11-06: 100 mg via INTRAVENOUS

## 2013-11-06 MED ORDER — SERTRALINE HCL 50 MG PO TABS: 150.0000 mg | ORAL_TABLET | Freq: Every day | ORAL | Status: DC

## 2013-11-06 MED ORDER — SODIUM CHLORIDE 0.9 % IV SOLN
INTRAVENOUS | Status: DC
  Administered 2013-11-06: 1000 mL via INTRAVENOUS

## 2013-11-06 MED ORDER — IOHEXOL 300 MG/ML  SOLN
80.0000 mL | Freq: Once | INTRAMUSCULAR | Status: AC | PRN
  Administered 2013-11-06: 80 mL via INTRAVENOUS

## 2013-11-06 MED ORDER — CLONAZEPAM 1 MG PO TABS: 1.0000 mg | ORAL_TABLET | Freq: Three times a day (TID) | ORAL | Status: DC

## 2013-11-06 MED ORDER — ACETAMINOPHEN 325 MG PO TABS: 650.0000 mg | ORAL_TABLET | Freq: Four times a day (QID) | ORAL | Status: DC | PRN

## 2013-11-06 SURGICAL SUPPLY — 71 items
BLADE 10 SAFETY STRL DISP (BLADE) IMPLANT
BLADE RECIP (BLADE) ×3 IMPLANT
BLADE SAW RECIP 87.9 MT (BLADE) IMPLANT
BLADE SAW SAG 73X25 THK (BLADE)
BLADE SAW SGTL 73X25 THK (BLADE) IMPLANT
BLADE SURG ROTATE 9660 (MISCELLANEOUS) IMPLANT
BRUSH FEMORAL CANAL (MISCELLANEOUS) IMPLANT
CAPT HIP HD POR BIPOL/UNIPOL ×3 IMPLANT
COVER BACK TABLE 24X17X13 BIG (DRAPES) IMPLANT
DRAPE INCISE IOBAN 66X45 STRL (DRAPES) IMPLANT
DRAPE ORTHO SPLIT 77X108 STRL (DRAPES) ×4
DRAPE PROXIMA HALF (DRAPES) IMPLANT
DRAPE SURG 17X23 STRL (DRAPES) IMPLANT
DRAPE SURG ORHT 6 SPLT 77X108 (DRAPES) ×2 IMPLANT
DRAPE U-SHAPE 47X51 STRL (DRAPES) ×3 IMPLANT
DRILL BIT 1/8DIAX5INL DISPOSE (BIT) IMPLANT
DRSG MEPILEX BORDER 4X12 (GAUZE/BANDAGES/DRESSINGS) ×3 IMPLANT
DRSG PAD ABDOMINAL 8X10 ST (GAUZE/BANDAGES/DRESSINGS) IMPLANT
DURAPREP 26ML APPLICATOR (WOUND CARE) ×3 IMPLANT
ELECT BLADE 6.5 EXT (BLADE) IMPLANT
ELECT CAUTERY BLADE 6.4 (BLADE) ×3 IMPLANT
ELECT REM PT RETURN 9FT ADLT (ELECTROSURGICAL) ×3
ELECTRODE REM PT RTRN 9FT ADLT (ELECTROSURGICAL) ×1 IMPLANT
EVACUATOR 1/8 PVC DRAIN (DRAIN) IMPLANT
FACESHIELD WRAPAROUND (MASK) IMPLANT
GAUZE XEROFORM 5X9 LF (GAUZE/BANDAGES/DRESSINGS) ×3 IMPLANT
GLOVE BIO SURGEON ST LM GN SZ9 (GLOVE) IMPLANT
GLOVE BIOGEL PI IND STRL 8 (GLOVE) ×1 IMPLANT
GLOVE BIOGEL PI INDICATOR 8 (GLOVE) ×2
GLOVE SURG ORTHO 8.0 STRL STRW (GLOVE) ×6 IMPLANT
GOWN STRL REUS W/ TWL LRG LVL3 (GOWN DISPOSABLE) ×1 IMPLANT
GOWN STRL REUS W/ TWL XL LVL3 (GOWN DISPOSABLE) ×1 IMPLANT
GOWN STRL REUS W/TWL LRG LVL3 (GOWN DISPOSABLE) ×2
GOWN STRL REUS W/TWL XL LVL3 (GOWN DISPOSABLE) ×2
HANDPIECE INTERPULSE COAX TIP (DISPOSABLE)
HOOD PEEL AWAY FACE SHEILD DIS (HOOD) ×3 IMPLANT
IMMOBILIZER KNEE 20 (SOFTGOODS) IMPLANT
IMMOBILIZER KNEE 22 UNIV (SOFTGOODS) ×3 IMPLANT
IMMOBILIZER KNEE 24 THIGH 36 (MISCELLANEOUS) IMPLANT
IMMOBILIZER KNEE 24 UNIV (MISCELLANEOUS)
KIT BASIN OR (CUSTOM PROCEDURE TRAY) ×3 IMPLANT
KIT ROOM TURNOVER OR (KITS) ×3 IMPLANT
MANIFOLD NEPTUNE II (INSTRUMENTS) ×3 IMPLANT
NDL SUT .5 MAYO 1.404X.05X (NEEDLE) IMPLANT
NEEDLE HYPO 25GX1X1/2 BEV (NEEDLE) ×3 IMPLANT
NEEDLE MAYO TAPER (NEEDLE)
NS IRRIG 1000ML POUR BTL (IV SOLUTION) ×3 IMPLANT
PACK TOTAL JOINT (CUSTOM PROCEDURE TRAY) ×3 IMPLANT
PAD ARMBOARD 7.5X6 YLW CONV (MISCELLANEOUS) ×6 IMPLANT
PASSER SUT SWANSON 36MM LOOP (INSTRUMENTS) IMPLANT
PILLOW ABDUCTION HIP (SOFTGOODS) IMPLANT
PIN STEINMAN 3/16 (PIN) IMPLANT
SET HNDPC FAN SPRY TIP SCT (DISPOSABLE) IMPLANT
SPONGE GAUZE 4X4 12PLY (GAUZE/BANDAGES/DRESSINGS) IMPLANT
SPONGE LAP 18X18 X RAY DECT (DISPOSABLE) IMPLANT
SPONGE LAP 4X18 X RAY DECT (DISPOSABLE) IMPLANT
STAPLER VISISTAT 35W (STAPLE) IMPLANT
SUCTION FRAZIER TIP 10 FR DISP (SUCTIONS) ×3 IMPLANT
SUT ETHIBOND NAB CT1 #1 30IN (SUTURE) ×9 IMPLANT
SUT VIC AB 0 CT1 27 (SUTURE) ×4
SUT VIC AB 0 CT1 27XBRD ANBCTR (SUTURE) ×2 IMPLANT
SUT VIC AB 1 CT1 27 (SUTURE) ×4
SUT VIC AB 1 CT1 27XBRD ANBCTR (SUTURE) ×2 IMPLANT
SUT VIC AB 2-0 CT1 27 (SUTURE) ×6
SUT VIC AB 2-0 CT1 TAPERPNT 27 (SUTURE) ×3 IMPLANT
SYR CONTROL 10ML LL (SYRINGE) ×3 IMPLANT
TOWEL OR 17X24 6PK STRL BLUE (TOWEL DISPOSABLE) ×3 IMPLANT
TOWEL OR 17X26 10 PK STRL BLUE (TOWEL DISPOSABLE) ×3 IMPLANT
TOWER CARTRIDGE SMART MIX (DISPOSABLE) IMPLANT
TRAY FOLEY CATH 16FRSI W/METER (SET/KITS/TRAYS/PACK) ×3 IMPLANT
WATER STERILE IRR 1000ML POUR (IV SOLUTION) IMPLANT

## 2013-11-06 NOTE — Anesthesia Preprocedure Evaluation (Addendum)
Anesthesia Evaluation  Patient identified by MRN, date of birth, ID band Patient awake    Reviewed: Allergy & Precautions, H&P , NPO status   History of Anesthesia Complications Negative for: history of anesthetic complications  Airway Mallampati: II TM Distance: >3 FB Neck ROM: Full    Dental  (+) Dental Advisory Given, Edentulous Upper, Edentulous Lower   Pulmonary COPDCurrent Smoker,  breath sounds clear to auscultation        Cardiovascular hypertension, Pt. on medications - anginaRhythm:Regular Rate:Normal     Neuro/Psych PSYCHIATRIC DISORDERS Anxiety Depression negative neurological ROS     GI/Hepatic negative GI ROS, Neg liver ROS,   Endo/Other  Hypothyroidism   Renal/GU negative Renal ROS     Musculoskeletal   Abdominal   Peds  Hematology   Anesthesia Other Findings   Reproductive/Obstetrics                         Anesthesia Physical Anesthesia Plan  ASA: II  Anesthesia Plan: General   Post-op Pain Management:    Induction: Intravenous  Airway Management Planned: Oral ETT  Additional Equipment:   Intra-op Plan:   Post-operative Plan: Extubation in OR  Informed Consent: I have reviewed the patients History and Physical, chart, labs and discussed the procedure including the risks, benefits and alternatives for the proposed anesthesia with the patient or authorized representative who has indicated his/her understanding and acceptance.   Dental advisory given  Plan Discussed with: CRNA and Surgeon  Anesthesia Plan Comments: (Plan routine monitors, GETA )        Anesthesia Quick Evaluation

## 2013-11-06 NOTE — Anesthesia Postprocedure Evaluation (Signed)
  Anesthesia Post-op Note  Patient: Kara Mcdowell  Procedure(s) Performed: Procedure(s): ARTHROPLASTY BIPOLAR HIP (Right)  Patient Location: PACU  Anesthesia Type:General  Level of Consciousness: awake, alert , oriented and patient cooperative  Airway and Oxygen Therapy: Patient Spontanous Breathing and Patient connected to nasal cannula oxygen  Post-op Pain: none  Post-op Assessment: Post-op Vital signs reviewed, Patient's Cardiovascular Status Stable, Respiratory Function Stable, Patent Airway, No signs of Nausea or vomiting and Pain level controlled  Post-op Vital Signs: Reviewed and stable  Last Vitals:  Filed Vitals:   11/06/13 1600  BP: 119/69  Pulse: 87  Temp:   Resp: 13    Complications: No apparent anesthesia complications

## 2013-11-06 NOTE — Brief Op Note (Signed)
11/05/2013 - 11/06/2013  3:16 PM  PATIENT:  Kara Mcdowell  80 y.o. female  PRE-OPERATIVE DIAGNOSIS:  right femur neck fracture  POST-OPERATIVE DIAGNOSIS:  right femur neck fracture  PROCEDURE:  Procedure(s): ARTHROPLASTY BIPOLAR HIP  SURGEON:  Surgeon(s): Meredith Pel, MD  ASSISTANT: b roberts pa  ANESTHESIA:   general  EBL: 150 ml    Total I/O In: 1000 [I.V.:1000] Out: 200 [Blood:200]  BLOOD ADMINISTERED: none  DRAINS: none   LOCAL MEDICATIONS USED:  none  SPECIMEN:  No Specimen  COUNTS:  YES  TOURNIQUET:  * No tourniquets in log *  DICTATION: .Other Dictation: Dictation Number done  PLAN OF CARE: Admit to inpatient   PATIENT DISPOSITION:  PACU - hemodynamically stable

## 2013-11-06 NOTE — Progress Notes (Signed)
ANTICOAGULATION CONSULT NOTE - Initial Consult  Pharmacy Consult for warfarin Indication: VTE prophylaxis  No Known Allergies  Patient Measurements:    Vital Signs: Temp: 98.4 F (36.9 C) (05/17 1710) Temp src: Oral (05/17 1115) BP: 119/53 mmHg (05/17 1710) Pulse Rate: 80 (05/17 1710)  Labs:  Recent Labs  11/05/13 1833 11/06/13 0615  HGB 14.2 13.0  HCT 43.0 39.7  PLT 120* 121*  APTT 30  --   LABPROT 12.4  --   INR 0.94  --   CREATININE 1.05 1.10    CrCl is unknown because there is no height on file for the current visit.   Medical History: Past Medical History  Diagnosis Date  . Hypertension   . Anxiety   . Depression   . COPD (chronic obstructive pulmonary disease)     Medications:  Prescriptions prior to admission  Medication Sig Dispense Refill  . atorvastatin (LIPITOR) 80 MG tablet Take 80 mg by mouth at bedtime.      . clonazePAM (KLONOPIN) 1 MG tablet Take 1 mg by mouth 3 (three) times daily.      Marland Kitchen levothyroxine (SYNTHROID, LEVOTHROID) 88 MCG tablet Take 88 mcg by mouth daily before breakfast.      . lisinopril (PRINIVIL,ZESTRIL) 30 MG tablet Take 30 mg by mouth daily.      . sertraline (ZOLOFT) 100 MG tablet Take 150 mg by mouth daily.        Assessment: 58 yof s/p fall w/ broken hip, now s/p hip arthroplasty. To start warfarin for VTE prophylaxis. Baseline INR and H/H are WNL. Plts are slightly low. She was not on any anticoagulation PTA.   Goal of Therapy:  INR 2-3   Plan:  1. Warfarin 5mg  PO x 1 tonight 2. Daily INR 3. Coumadin education materials to patient  Rande Lawman Barnabas Henriques 11/06/2013,5:16 PM

## 2013-11-06 NOTE — Transfer of Care (Signed)
Immediate Anesthesia Transfer of Care Note  Patient: Kara Mcdowell  Procedure(s) Performed: Procedure(s): ARTHROPLASTY BIPOLAR HIP (Right)  Patient Location: PACU  Anesthesia Type:General  Level of Consciousness: sedated  Airway & Oxygen Therapy: Patient Spontanous Breathing and Patient connected to face mask oxygen  Post-op Assessment: Report given to PACU RN and Post -op Vital signs reviewed and stable  Post vital signs: Reviewed and stable  Complications: No apparent anesthesia complications

## 2013-11-06 NOTE — Progress Notes (Addendum)
Patient ID: Kara Mcdowell, female   DOB: [birth year], 80 y.o.   MRN: [id number]  [facility] PROGRESS NOTE  Kara Mcdowell FGH:[id number] DOB: [birth year] DOA: 11/05/2013 PCP: No primary provider on file.  Brief narrative: 80 y.o. female who is fairly active states that she was at home, after breakfast went outdoors and was trying to swat some bumble bees, She states that her foot got caught on the concrete and she fell on her right side. She states that her right foot was intially numb after the fall but now is better. She states that she had no dizziness and she had no headache. She denies any loss of consciousness. She states that there was no chest pain noted. She has sustained right hip fracture and [facility] asked to admit for further evaluation.   Principal Problem:   Intertrochanteric fracture of right hip - Plan for right hip hemiarthroplasty today - appreciate ortho assistance  Active Problems:   Essential hypertension, benign - on the soft side, will discontinue Lisinopril    ? Lung nodule - will place order for CT chest with contrast for further evaluation    Anxiety state, unspecified - clinically stable, continue clonazepam as needed as per home medical regimen     COPD exacerbation - clinically compensated and maintaining oxygen saturation on target range on oxygen via [place]    Unspecified hypothyroidism - continue synthroid   Consultants:  Ortho   Procedures/Studies: CXR 11/06/2013  Irregular density in the lingula. ? Neoplasm/area of scarring, chest CT with contrast recommended. COPD.  Antibiotics:  None  Code Status: Full Family Communication: Pt at bedside Disposition Plan: Remains inpatient   HPI/Subjective: No events overnight.   Objective: Filed Vitals:   11/05/13 1614 11/05/13 2326 11/06/13 0516 11/06/13 0517  BP: 147/74 123/54 90/77   Pulse: 84 73 84   Temp: 98.2 F (36.8 C) 98.2 F (36.8 C) 102 F (38.9 C)   TempSrc:  Oral Oral   Resp: 18 18 16     SpO2: 91% 93% 83% 92%    Intake/Output Summary (Last 24 hours) at 11/06/13 0943 Last data filed at 11/05/13 1800  Gross per 24 hour  Intake      0 ml  Output    300 ml  Net   -300 ml    Exam:   General:  Pt is alert, follows commands appropriately, not in acute distress  Cardiovascular: Regular rate and rhythm, S1/S2, no murmurs, no rubs, no gallops  Respiratory: Clear to auscultation bilaterally, diminished breath sounds at bases   Abdomen: Soft, non tender, non distended, bowel sounds present, no guarding  Extremities: No edema, pulses DP and PT palpable bilaterally, TTP on the right hip area   Data Reviewed: Basic Metabolic Panel:  Recent Labs Lab 11/05/13 1833 11/06/13 0615  NA 140 134*  K 4.1 4.1  CL 102 100  CO2 26 25  GLUCOSE 117* 102*  BUN 8 13  CREATININE 1.05 1.10  CALCIUM 9.2 8.5  MG 1.8  --   PHOS 2.5  --    Liver Function Tests:  Recent Labs Lab 11/05/13 1833 11/06/13 0615  AST 40* 36  ALT 24 22  ALKPHOS 97 84  BILITOT 0.5 0.5  PROT 6.7 6.1  ALBUMIN 3.7 3.2*   CBC:  Recent Labs Lab 11/05/13 1833 11/06/13 0615  WBC 5.1 3.9*  NEUTROABS 4.4  --   HGB 14.2 13.0  HCT 43.0 39.7  MCV 92.1 91.1  PLT 120* 121*   Recent Results (  from the past 240 hour(s))  SURGICAL PCR SCREEN     Status: Abnormal   Collection Time    11/06/13  5:25 AM      Result Value Ref Range Status   MRSA, PCR NEGATIVE  NEGATIVE Final   Staphylococcus aureus POSITIVE (*) NEGATIVE Final   Comment:            The Xpert SA Assay (FDA     approved for NASAL specimens     in patients over 32 years of age),     is one component of     a comprehensive surveillance     program.  Test performance has     been validated by [employer] for patients greater     than or equal to 59 year old.     It is not intended     to diagnose infection nor to     guide or monitor treatment.     Scheduled Meds: . atorvastatin  80 mg Oral q1800  . clonazePAM  1 mg Oral TID   . folic acid  1 mg Oral Daily  . levothyroxine  88 mcg Oral QAC breakfast  . lisinopril  30 mg Oral Daily  . sertraline  150 mg Oral Daily  . thiamine  100 mg Oral Daily   Continuous Infusions: . sodium chloride 1,000 mL (11/06/13 0545)     Theodis Blaze, MD  [facility] Pager [phone]  If 7PM-7AM, please contact night-coverage [email or web address] Password [email or web address] 11/06/2013, 9:43 AM   LOS: 1 day

## 2013-11-06 NOTE — Anesthesia Procedure Notes (Signed)
Procedure Name: Intubation Date/Time: 11/06/2013 1:16 PM Performed by: Carola Frost Pre-anesthesia Checklist: Patient identified, Timeout performed, Emergency Drugs available, Suction available and Patient being monitored Patient Re-evaluated:Patient Re-evaluated prior to inductionOxygen Delivery Method: Circle system utilized Preoxygenation: Pre-oxygenation with 100% oxygen Intubation Type: IV induction Ventilation: Mask ventilation without difficulty Laryngoscope Size: Mac and 3 Grade View: Grade I Tube type: Oral Tube size: 7.0 mm Number of attempts: 1 Placement Confirmation: CO2 detector,  positive ETCO2,  ETT inserted through vocal cords under direct vision and breath sounds checked- equal and bilateral Secured at: 21 cm Tube secured with: Tape Dental Injury: Teeth and Oropharynx as per pre-operative assessment

## 2013-11-07 DIAGNOSIS — F3289 Other specified depressive episodes: Secondary | ICD-10-CM

## 2013-11-07 DIAGNOSIS — F329 Major depressive disorder, single episode, unspecified: Secondary | ICD-10-CM

## 2013-11-07 LAB — CBC
HCT: 33.5 % — ABNORMAL LOW (ref 36.0–46.0)
HEMOGLOBIN: 11 g/dL — AB (ref 12.0–15.0)
MCH: 29.9 pg (ref 26.0–34.0)
MCHC: 32.8 g/dL (ref 30.0–36.0)
MCV: 91 fL (ref 78.0–100.0)
Platelets: 85 10*3/uL — ABNORMAL LOW (ref 150–400)
RBC: 3.68 MIL/uL — AB (ref 3.87–5.11)
RDW: 14.6 % (ref 11.5–15.5)
WBC: 4.7 10*3/uL (ref 4.0–10.5)

## 2013-11-07 LAB — BASIC METABOLIC PANEL WITH GFR
BUN: 13 mg/dL (ref 6–23)
CO2: 25 meq/L (ref 19–32)
Calcium: 7.6 mg/dL — ABNORMAL LOW (ref 8.4–10.5)
Chloride: 98 meq/L (ref 96–112)
Creatinine, Ser: 1.21 mg/dL — ABNORMAL HIGH (ref 0.50–1.10)
GFR calc Af Amer: 47 mL/min — ABNORMAL LOW (ref >90)
GFR calc non Af Amer: 41 mL/min — ABNORMAL LOW (ref >90)
Glucose, Bld: 110 mg/dL — ABNORMAL HIGH (ref 70–99)
Potassium: 3.8 meq/L (ref 3.7–5.3)
Sodium: 133 meq/L — ABNORMAL LOW (ref 137–147)

## 2013-11-07 LAB — PROTIME-INR
INR: 1.17 (ref 0.00–1.49)
Prothrombin Time: 14.7 s (ref 11.6–15.2)

## 2013-11-07 LAB — GLUCOSE, CAPILLARY: GLUCOSE-CAPILLARY: 139 mg/dL — AB (ref 70–99)

## 2013-11-07 MED ORDER — ENSURE COMPLETE PO LIQD
237.0000 mL | Freq: Two times a day (BID) | ORAL | Status: DC
  Administered 2013-11-07 – 2013-11-08 (×3): 237 mL via ORAL

## 2013-11-07 MED ORDER — WARFARIN SODIUM 5 MG PO TABS: 5.0000 mg | ORAL_TABLET | Freq: Every day | ORAL | Status: DC

## 2013-11-07 MED ORDER — SODIUM CHLORIDE 0.9 % IV SOLN
INTRAVENOUS | Status: DC
  Administered 2013-11-07 – 2013-11-08 (×3): via INTRAVENOUS

## 2013-11-07 MED ORDER — HYDROCODONE-ACETAMINOPHEN 5-325 MG PO TABS: 1.0000 | ORAL_TABLET | Freq: Four times a day (QID) | ORAL | Status: DC | PRN

## 2013-11-07 MED ORDER — WARFARIN SODIUM 5 MG PO TABS
5.0000 mg | ORAL_TABLET | Freq: Once | ORAL | Status: AC
  Administered 2013-11-07: 5 mg via ORAL
  Filled 2013-11-07 (×2): qty 1

## 2013-11-07 NOTE — Evaluation (Signed)
Physical Therapy Evaluation Patient Details Name: Kara Mcdowell MRN: [id number] DOB: [birth year] Today's Date: 11/07/2013   History of Present Illness  Pt is an 80 yo female admitted for R hip hemiarthroplasty s/p fall at home with resulting R hip fx.  Pt is WBAT and has posterior hip precautions.  Clinical Impression  Pt required max-A +2 for sit to/from stand and pivot transfers as well as sit to supine bed mobility.  Pt was shivering, drooling, and tended to gaze L but was able to gaze R with persistent VC.  Pt was unable to recall hip precautions even immediately after two repetitions of precaution education with demonstration.  Her speech was garbled, though her sister stated that she sounds as she does at baseline today.  Pt required repeated VC to follow simple commands, and follow through with commanded activities was lethargic.  Nurse notified of pt cognitive and activity changes, nurse was in room taking vitals upon PT departure.  Recommend SNF placement for rehab upon d/c from acute care.  Will continue to follow.    Follow Up Recommendations SNF    Equipment Recommendations  Rolling walker with 5" wheels    Recommendations for Other Services       Precautions / Restrictions Precautions Precautions: Posterior Hip Precaution Booklet Issued: Yes (comment) Precaution Comments: Hip precautions reviewed twice with discussion and demonstration.  Knee immobilizer donned throughout session. Required Braces or Orthoses: Knee Immobilizer - Right Restrictions Weight Bearing Restrictions: Yes RLE Weight Bearing: Weight bearing as tolerated      Mobility  Bed Mobility Overal bed mobility: Needs Assistance Bed Mobility: Sit to Supine     Supine to sit: Mod assist Sit to supine: Max assist;+2 for physical assistance   General bed mobility comments: Pt required max-A +2 to lift bil. LE into bed and lower trunk to supine.  Pt had difficulty following commands, was shivering, and  describing increased pain in R hip.  Transfers Overall transfer level: Needs assistance Equipment used: Rolling walker (2 wheeled);2 person hand held assist Transfers: Sit to/from [facility] Sit to Stand: Max assist;+2 physical assistance Stand pivot transfers: Max assist;+2 physical assistance       General transfer comment: Pt had difficulty following VC to push through bil. UE and L LE to stand and required max-A +2 to come up to standing at RW with sustained max-A +2 to maintain standing.  Pt unable to use UE for hand-hold assist during pivot transfer and required max-A +2 to pivot to EOB.  Ambulation/Gait                Stairs            Wheelchair Mobility    Modified Rankin (Stroke Patients Only)       Balance Overall balance assessment: Needs assistance Sitting-balance support: Bilateral upper extremity supported;Feet unsupported Sitting balance-Leahy Scale: Poor Sitting balance - Comments: Pt required min-A to maintain sitting balance at EOB.   Standing balance support: Bilateral upper extremity supported Standing balance-Leahy Scale: Zero Standing balance comment: Pt placed bil. UE on RW handles with VC but relied heavily on max-A +2 to support her body weight and posture in standing.                             Pertinent Vitals/Pain Pt reports increased pain and requested pain medication.  Nurse notified.    Home Living Family/patient expects to be discharged to:: Skilled nursing  facility Living Arrangements: Alone               Additional Comments: Pt was very I living alone in home with 4 steps and rails to enter.    Prior Function Level of Independence: Independent               Hand Dominance   Dominant Hand: Right    Extremity/Trunk Assessment   Upper Extremity Assessment: Defer to OT evaluation           Lower Extremity Assessment: Generalized weakness;RLE deficits/detail RLE Deficits /  Details: increased pain in R LE, decreased tolerance to WB    Cervical / Trunk Assessment: Normal  Communication   Communication: No difficulties  Cognition Arousal/Alertness: Lethargic Behavior During Therapy: Flat affect Overall Cognitive Status: History of cognitive impairments - at baseline       Memory: Decreased recall of precautions;Decreased short-term memory              General Comments General comments (skin integrity, edema, etc.): Pt lethargic, required repeated VC to keep her eyes open and follow commands.  Speech sounded garbled to PT, but pt's sister reports that she sounds today like she does at baseline.  She was unable to repeat hip precautions after two repetitions of precaution description with demonstration.  Pt shivering and drooling out of R side of her mouth with gaze to the L.  She could shift gaze R with persistent verbal cueing.  Required repeated commands during physical sequencing, such as letting go of chair arm to allow pivot transfer.    Exercises        Assessment/Plan    PT Assessment Patient needs continued PT services  PT Diagnosis Difficulty walking;Generalized weakness;Acute pain;Altered mental status   PT Problem List Decreased strength;Decreased activity tolerance;Decreased balance;Decreased mobility;Decreased cognition;Decreased knowledge of use of DME;Decreased safety awareness;Decreased knowledge of precautions;Pain  PT Treatment Interventions DME instruction;Gait training;Functional mobility training;Therapeutic activities;Therapeutic exercise;Balance training;Patient/family education;Cognitive remediation   PT Goals (Current goals can be found in the Care Plan section) Acute Rehab PT Goals Patient Stated Goal: none stated PT Goal Formulation: With patient/family Time For Goal Achievement: 11/14/13 Potential to Achieve Goals: Good    Frequency Min 3X/week   Barriers to discharge        Co-evaluation               End  of Session Equipment Utilized During Treatment: Gait belt;Right knee immobilizer Activity Tolerance: Patient limited by lethargy;Patient limited by fatigue;Patient limited by pain Patient left: in bed;with call bell/phone within reach;with nursing/sitter in room;with bed alarm set (Nurse in room taking vitals upon PT departure) Nurse Communication: Mobility status;Patient requests pain meds;Other (comment) (Nurse alerted to potential changing cognitive status)         Time: 1350-1426 PT Time Calculation (min): 36 min   Charges:   PT Evaluation $Initial PT Evaluation Tier I: 1 Procedure PT Treatments $Therapeutic Activity: 23-37 mins   PT G Codes:          Delicia Berens, SPT 11/07/2013, 3:21 PM

## 2013-11-07 NOTE — Progress Notes (Signed)
Subjective: Pt stable - pain ok   Objective: Vital signs in last 24 hours: Temp:  [98 F (36.7 C)-101.7 F (38.7 C)] 100.8 F (38.2 C) (05/18 1500) Pulse Rate:  [86-97] 93 (05/18 1500) Resp:  [16] 16 (05/18 1500) BP: (84-114)/(45-68) 110/60 mmHg (05/18 1500) SpO2:  [95 %-100 %] 96 % (05/18 1500) Weight:  [63.504 kg (140 lb)] 63.504 kg (140 lb) (05/18 1500)  Intake/Output from previous day: 05/17 0701 - 05/18 0700 In: 3615 [P.O.:840; I.V.:2775] Out: 875 [Urine:675; Blood:200] Intake/Output this shift: Total I/O In: 480 [P.O.:480] Out: -   Exam:  Sensation intact distally Intact pulses distally Dorsiflexion/Plantar flexion intact  Labs:  Recent Labs  11/05/13 1833 11/06/13 0615 11/07/13 0640  HGB 14.2 13.0 11.0*    Recent Labs  11/06/13 0615 11/07/13 0640  WBC 3.9* 4.7  RBC 4.36 3.68*  HCT 39.7 33.5*  PLT 121* 85*    Recent Labs  11/06/13 0615 11/07/13 0640  NA 134* 133*  K 4.1 3.8  CL 100 98  CO2 25 25  BUN 13 13  CREATININE 1.10 1.21*  GLUCOSE 102* 110*  CALCIUM 8.5 7.6*    Recent Labs  11/05/13 1833 11/07/13 0640  INR 0.94 1.17    Assessment/Plan: Plan snf tues or wed - rx on chart   Meredith Pel 11/07/2013, 5:44 PM

## 2013-11-07 NOTE — Op Note (Signed)
[name], [name] NO.:  [id number]  MEDICAL RECORD NO.:  [id number]  LOCATION:  [id number]                        FACILITY:  [facility]  PHYSICIAN:  Anderson Malta, M.D.    DATE OF BIRTH:  01-14-1930  DATE OF PROCEDURE:  11/06/2013 DATE OF DISCHARGE:                              OPERATIVE REPORT   PREOPERATIVE DIAGNOSES:  Right hip fracture, displaced femoral neck.  POSTOPERATIVE DIAGNOSES:  Right hip fracture, displaced femoral neck.  PROCEDURE:  Right hip hemiarthroplasty, DePuy Tri-Lock size 3 with a -3 head and unipolar ball.  SURGEON:  Anderson Malta, M.D.  ASSISTANT:  Nehemiah Massed, PA-C  ANESTHESIA:  General.  INDICATIONS:  Kara Mcdowell is the patient with right hip pain who presents for operative management of displaced femoral neck fracture after explanation of risks and benefits.  She had a fall.  PROCEDURE IN DETAIL:  The patient was brought to the operating room, where general endotracheal anesthesia was induced.  Preoperative antibiotics were administered.  Time-out was called.  Right leg was prescrubbed with alcohol and Betadine, prepped with DuraPrep solution and __________ draped in sterile manner.  The patient was placed in lateral decubitus position with the right hip up.  Time-out was called. Ioban used for the operative field.  After sterile prepping and draping, a posterior approach was made.  Skin and subcutaneous tissue sharply divided.  Fascia lata was divided.  Sciatic nerve palpated and protected all times during the case.  Charnley retractor were placed.  Piriformis tendon tagged and retracted, external rotators detached.  Capsule incised in a T-shaped fashion, head removed, incised.  Approach used to make the femoral neck cut.  Broaching performed up to size 3 which gave a very stable excellent fit.  Calcar planing performed.  Trial reduction performed via +0 and -3 head which gave excellent stability in the full extension, external  rotation, position of sleep, as well as 90 degrees of hip flexion, 10 degrees of adduction, and 6 degrees of internal rotation.  True stem prosthesis was placed which was up about 2-3 mm. Thorough irrigation performed and a -3 neck was chosen with same head ball size and same stability parameters maintained.  At this time, thorough irrigation was performed again.  The capsule was closed using #1 Vicryl suture.  Piriformis tendon tagged with a capsule of fascia lata.  Then, the fascia lata closed using interrupted inverted #1 Vicryl suture, followed by interrupted inverted 0 Vicryl suture __________ skin staples.  The patient's leg length equal at end of the case.  She tolerated the procedure well without immediate complication. Transferred to the recovery room in stable condition.  Nehemiah Massed assistance required all times during the case for retraction, mobilization, and tissues opening, closing, protection of neurovascular structures __________ assistance was a medical necessity.     Anderson Malta, M.D.     GSD/MEDQ  D:  11/06/2013  T:  11/06/2013  Job:  [id number]

## 2013-11-07 NOTE — Evaluation (Signed)
Occupational Therapy Evaluation Patient Details Name: Kara Mcdowell MRN: [id number] DOB: 1929-11-18 Today's Date: 11/07/2013    History of Present Illness Pt is an 80 yo female admitted for R hip hemiarthroplasty s/p fall at home with resulting R hip fx.  Pt is WBAT and has posterior hip precautions.   Clinical Impression   Pt admitted with the above diagnosis and has the deficits listed below. Pt would benefit from cont OT to increase I with her basic adls so she can eventually return home alone to being fully independent after SNF rehab.  Pt is very motivated and should be able to be I after rehab.    Follow Up Recommendations  SNF;Supervision/Assistance - 24 hour    Equipment Recommendations  3 in 1 bedside comode    Recommendations for Other Services       Precautions / Restrictions Precautions Precautions: Posterior Hip Precaution Booklet Issued: Yes (comment) Precaution Comments: hip precautions discussed and posted on wall. Required Braces or Orthoses: Knee Immobilizer - Right Restrictions Weight Bearing Restrictions: Yes RLE Weight Bearing: Weight bearing as tolerated      Mobility Bed Mobility Overal bed mobility: Needs Assistance Bed Mobility: Supine to Sit     Supine to sit: Mod assist     General bed mobility comments: Pt did better if you let her come to the side of bed w/o pulling the pad.  pt needed extra time but was able to scoot to side of bed with very little assistance.  Transfers Overall transfer level: Needs assistance Equipment used: Rolling walker (2 wheeled) Transfers: Sit to/from [facility] Sit to Stand: Mod assist Stand pivot transfers: Mod assist       General transfer comment: Once on her feet pt did well moving feet/walker to transfer.  Pt wants to push up on walker with both hands to stand.  Worked on hand placment for standing at length.    Balance Overall balance assessment: Needs assistance Sitting-balance  support: Bilateral upper extremity supported;Feet supported Sitting balance-Leahy Scale: Good     Standing balance support: Bilateral upper extremity supported;During functional activity Standing balance-Leahy Scale: Poor Standing balance comment: Pt needs walker and outside assist to maintain standing.                            ADL Overall ADL's : Needs assistance/impaired Eating/Feeding: Set up;Sitting   Grooming: Set up;Sitting   Upper Body Bathing: Set up;Sitting   Lower Body Bathing: Maximal assistance;Sit to/from stand;Adhering to hip precautions;Cueing for compensatory techniques Lower Body Bathing Details (indicate cue type and reason): Pt has a hard time applying hip precautions to every day adls. Upper Body Dressing : Set up;Sitting   Lower Body Dressing: Maximal assistance;Sit to/from stand;Cueing for compensatory techniques;Adhering to hip precautions Lower Body Dressing Details (indicate cue type and reason): Pt will need to be introduced to AE for LE dressing. Toilet Transfer: Moderate assistance;BSC;Stand-pivot Toilet Transfer Details (indicate cue type and reason): Pt pivoted to [facility] with walker with mod assist and cues for foot and walker placement. Toileting- Clothing Manipulation and Hygiene: Moderate assistance;Sit to/from stand;Cueing for compensatory techniques;Adhering to hip precautions Toileting - Clothing Manipulation Details (indicate cue type and reason): Pt needs cues to not bed past 90 degrees when sitting and needs assist while on feet.     Functional mobility during ADLs: Moderate assistance;Rolling walker General ADL Comments: Pt does well with UE adls and needs more assist with LE adls due  to new posterior hip precautions.  Pt is very accustomed to being I and therefore comes off at times as being impulsive.  Pt likes to do for herself.     Vision                     Perception     Praxis      Pertinent Vitals/Pain Pt did  not rate her pain but did state that her R hip was very stiff.  All other vitals stable.     Hand Dominance Right   Extremity/Trunk Assessment Upper Extremity Assessment Upper Extremity Assessment: Overall WFL for tasks assessed   Lower Extremity Assessment Lower Extremity Assessment: Defer to PT evaluation   Cervical / Trunk Assessment Cervical / Trunk Assessment: Normal   Communication Communication Communication: No difficulties   Cognition Arousal/Alertness: Awake/alert Behavior During Therapy: WFL for tasks assessed/performed Overall Cognitive Status: Within Functional Limits for tasks assessed                     General Comments       Exercises       Shoulder Instructions      Home Living Family/patient expects to be discharged to:: Skilled nursing facility Living Arrangements: Alone                               Additional Comments: Pt was very I living alone in home with 4 steps and rails to enter.      Prior Functioning/Environment Level of Independence: Independent             OT Diagnosis: Generalized weakness;Acute pain   OT Problem List: Decreased strength;Impaired balance (sitting and/or standing);Decreased knowledge of use of DME or AE;Decreased knowledge of precautions;Pain   OT Treatment/Interventions: Self-care/ADL training;DME and/or AE instruction;Therapeutic activities    OT Goals(Current goals can be found in the care plan section) Acute Rehab OT Goals Patient Stated Goal: to be independent again. OT Goal Formulation: With patient Time For Goal Achievement: 11/21/13 Potential to Achieve Goals: Good ADL Goals Pt Will Perform Lower Body Bathing: with supervision;with adaptive equipment;sit to/from stand Pt Will Perform Lower Body Dressing: with supervision;with adaptive equipment;sit to/from stand Pt Will Perform Tub/Shower Transfer: Tub transfer;3 in 1;rolling walker;ambulating Additional ADL Goal #1: Pt will  complete all toileting with 3:1 over commode with S.  OT Frequency: Min 2X/week   Barriers to D/C: Decreased caregiver support  pt lives alone.  24/7 assist not available.       Co-evaluation              End of Session Equipment Utilized During Treatment: Rolling walker;Right knee immobilizer Nurse Communication: Mobility status;Precautions;Weight bearing status  Activity Tolerance: Patient tolerated treatment well Patient left: in chair;with call bell/phone within reach;with family/visitor present   Time: 1144-1230 OT Time Calculation (min): 46 min Charges:  OT General Charges $OT Visit: 1 Procedure OT Evaluation $Initial OT Evaluation Tier I: 1 Procedure OT Treatments $Self Care/Home Management : 38-52 mins G-Codes:    Vickki Muff 11/19/13, 12:46 PM [phone]

## 2013-11-07 NOTE — Progress Notes (Signed)
INITIAL NUTRITION ASSESSMENT  DOCUMENTATION CODES Per approved criteria  -Not Applicable   INTERVENTION: Ensure Complete po BID, each supplement provides 350 kcal and 13 grams of protein  NUTRITION DIAGNOSIS: Inadequate oral intake related to decreased appetite as evidenced by Meal Completion: 50%.   Goal: Pt to meet >/= 90% of their estimated nutrition needs   Monitor:  PO intake, weight trend, labs  Reason for Assessment: Pt identified as at nutrition risk on the Malnutrition Screen Tool/MD Consult  80 y.o. female  Admitting Dx: Intertrochanteric fracture of right hip  ASSESSMENT: Pt admitted from home after a fall with a hip fx, s/p repair. Per pt she does not know how much she usually weighs but feels that she does not eat well due to decreased appetite. Pt is unable to give specifics. Pt seemed slightly confused, she took a long time responding to my questions.  Nutrition-focused physical exam WNL.   Height: Ht Readings from Last 1 Encounters:  11/07/13 5\' 4"  (1.626 m)    Weight: Wt Readings from Last 1 Encounters:  11/07/13 140 lb (63.504 kg)    Ideal Body Weight: 54.5 kg   % Ideal Body Weight: 117%  Wt Readings from Last 10 Encounters:  11/07/13 140 lb (63.504 kg)  11/07/13 140 lb (63.504 kg)    Usual Body Weight: unknown  % Usual Body Weight: -  BMI:  Body mass index is 24.02 kg/(m^2).  Estimated Nutritional Needs: Kcal: 1500-1700 Protein: 65-75 grams Fluid: > 1.5 L/day  Skin: right hip incision  Diet Order: General  EDUCATION NEEDS: -No education needs identified at this time   Intake/Output Summary (Last 24 hours) at 11/07/13 1539 Last data filed at 11/07/13 1300  Gross per 24 hour  Intake   2595 ml  Output    675 ml  Net   1920 ml    Last BM: PTA   Labs:   Recent Labs Lab 11/05/13 1833 11/06/13 0615 11/07/13 0640  NA 140 134* 133*  K 4.1 4.1 3.8  CL 102 100 98  CO2 26 25 25   BUN 8 13 13   CREATININE 1.05 1.10 1.21*   CALCIUM 9.2 8.5 7.6*  MG 1.8  --   --   PHOS 2.5  --   --   GLUCOSE 117* 102* 110*    CBG (last 3)   Recent Labs  11/07/13 0825  GLUCAP 139*    Scheduled Meds: . atorvastatin  80 mg Oral q1800  . clonazePAM  1 mg Oral TID  . coumadin book   Does not apply Once  . folic acid  1 mg Oral Daily  . levothyroxine  88 mcg Oral QAC breakfast  . multivitamin with minerals  1 tablet Oral Daily  . sertraline  150 mg Oral Daily  . sodium chloride  3 mL Intravenous Q12H  . thiamine  100 mg Oral Daily  . warfarin  5 mg Oral ONCE-1800  . warfarin   Does not apply Once  . Warfarin - Pharmacist Dosing Inpatient   Does not apply q1800    Continuous Infusions: . sodium chloride 75 mL/hr at 11/07/13 1227    Past Medical History  Diagnosis Date  . Hypertension   . Anxiety   . Depression   . COPD (chronic obstructive pulmonary disease)     History reviewed. No pertinent past surgical history.  [place], [place], [place] Pager [phone] After Hours Pager

## 2013-11-07 NOTE — Care Management Note (Signed)
CARE MANAGEMENT NOTE 11/07/2013  Patient:  Kara Mcdowell, Kara Mcdowell   Account Number:  [id number]  Date Initiated:  11/07/2013  Documentation initiated by:  Ricki Miller  Subjective/Objective Assessment:   80 yr old admitted with right hip fracture. s/p right hip hemiarthroplasty.     Action/Plan:   Patient is from home but will require shortterm rehab at [facility]. Social worker is aware.   Anticipated DC Date:  11/08/2013   Anticipated DC Plan:  SKILLED NURSING FACILITY  In-house referral  Clinical Social Worker      DC Planning Services  CM consult      Choice offered to / List presented to:             Status of service:  Completed, signed off Medicare Important Message given?   (If response is "NO", the following Medicare IM given date fields will be blank) Date Medicare IM given:   Date Additional Medicare IM given:    Discharge Disposition:  [place]

## 2013-11-07 NOTE — Evaluation (Signed)
Agree with SPT.    David Rodriquez, PT [phone]  

## 2013-11-07 NOTE — Progress Notes (Addendum)
Pt back to bed by PT shakey alert oriented  BP 110/60 93 16 stated she was hurting pt had been shakey earlier in the day when she was hurting relief with pain med stated she is also on clonipin which she takes several times a day also ordered here scheduled.temp100.8

## 2013-11-07 NOTE — Progress Notes (Signed)
Patient ID: Kara Mcdowell, female   DOB: Nov 18, 1929, 80 y.o.   MRN: [id number]  [facility] PROGRESS NOTE  Kara Mcdowell WUJ:[id number] DOB: May 13, 1930 DOA: 11/05/2013 PCP: No primary provider on file.  Brief narrative:  80 y.o. female who is fairly active states that she was at home, after breakfast went outdoors and was trying to swat some bumble bees, She states that her foot got caught on the concrete and she fell on her right side. She states that her right foot was intially numb after the fall but now is better. She states that she had no dizziness and she had no headache. She denies any loss of consciousness. She states that there was no chest pain noted. She has sustained right hip fracture and [facility] asked to admit for further evaluation.   Principal Problem:  Intertrochanteric fracture of right hip  - status post right hip hemiarthroplasty, post op day #1, pt doing well and clinically stable this AM - appreciate ortho assistance  - Coumadin for DVT prophylaxis  Active Problems:  Essential hypertension, benign  - on the soft side, will discontinue Lisinopril  - provide IVF ? Lung nodule  - CT chest with contrast unremarkable for acute pathology  Anxiety state, unspecified  - clinically stable, continue clonazepam as needed as per home medical regimen  COPD exacerbation  - clinically compensated and maintaining oxygen saturation on target range on oxygen via [place]  Unspecified hypothyroidism  - continue synthroid   Consultants:  Ortho  Procedures/Studies:  CXR 11/06/2013 Irregular density in the lingula. ? Neoplasm/area of scarring, chest CT with contrast recommended. COPD. Antibiotics:  None  Code Status: Full  Family Communication: Pt at bedside  Disposition Plan: Pt evaluation pending, d/c plan to be determined based on PT evaluation    HPI/Subjective: No events overnight.   Objective: Filed Vitals:   11/06/13 1710 11/06/13 2035 11/07/13 0002 11/07/13 0507  BP:  119/53 114/53 93/46 84/68   Pulse: 80 87 86 97  Temp: 98.4 F (36.9 C) 101.4 F (38.6 C) 101.7 F (38.7 C) 99.7 F (37.6 C)  TempSrc:      Resp: 16 16 16 16   SpO2: 95% 95% 100% 96%    Intake/Output Summary (Last 24 hours) at 11/07/13 0743 Last data filed at 11/07/13 0647  Gross per 24 hour  Intake   1875 ml  Output    875 ml  Net   1000 ml    Exam:   General:  Pt is alert, follows commands appropriately, not in acute distress  Cardiovascular: Regular rate and rhythm, no rubs, no gallops  Respiratory: Clear to auscultation bilaterally, no wheezing, no crackles, no rhonchi  Abdomen: Soft, non tender, non distended, bowel sounds present, no guarding  Data Reviewed: Basic Metabolic Panel:  Recent Labs Lab 11/05/13 1833 11/06/13 0615  NA 140 134*  K 4.1 4.1  CL 102 100  CO2 26 25  GLUCOSE 117* 102*  BUN 8 13  CREATININE 1.05 1.10  CALCIUM 9.2 8.5  MG 1.8  --   PHOS 2.5  --    Liver Function Tests:  Recent Labs Lab 11/05/13 1833 11/06/13 0615  AST 40* 36  ALT 24 22  ALKPHOS 97 84  BILITOT 0.5 0.5  PROT 6.7 6.1  ALBUMIN 3.7 3.2*   CBC:  Recent Labs Lab 11/05/13 1833 11/06/13 0615  WBC 5.1 3.9*  NEUTROABS 4.4  --   HGB 14.2 13.0  HCT 43.0 39.7  MCV 92.1 91.1  PLT 120* 121*  Recent Results (from the past 240 hour(s))  SURGICAL PCR SCREEN     Status: Abnormal   Collection Time    11/06/13  5:25 AM      Result Value Ref Range Status   MRSA, PCR NEGATIVE  NEGATIVE Final   Staphylococcus aureus POSITIVE (*) NEGATIVE Final   Comment:            The Xpert SA Assay (FDA     approved for NASAL specimens     in patients over 93 years of age),     is one component of     a comprehensive surveillance     program.  Test performance has     been validated by [employer] for patients greater     than or equal to 106 year old.     It is not intended     to diagnose infection nor to     guide or monitor treatment.     Scheduled Meds: .  atorvastatin  80 mg Oral q1800  . clonazePAM  1 mg Oral TID  . coumadin book   Does not apply Once  . folic acid  1 mg Oral Daily  . levothyroxine  88 mcg Oral QAC breakfast  . lisinopril  30 mg Oral Daily  . multivitamin with minerals  1 tablet Oral Daily  . sertraline  150 mg Oral Daily  . sodium chloride  3 mL Intravenous Q12H  . thiamine  100 mg Oral Daily  . warfarin   Does not apply Once  . Warfarin - Pharmacist Dosing Inpatient   Does not apply q1800   Continuous Infusions: . sodium chloride 1,000 mL (11/06/13 0545)  . 0.9 % NaCl with KCl 20 mEq / L     Theodis Blaze, MD  [facility] Pager [phone]  If 7PM-7AM, please contact night-coverage [email or web address] Password [email or web address] 11/07/2013, 7:43 AM   LOS: 2 days

## 2013-11-07 NOTE — Progress Notes (Signed)
ANTICOAGULATION CONSULT NOTE - Initial Consult  Pharmacy Consult for warfarin Indication: VTE prophylaxis  No Known Allergies  Patient Measurements:    Vital Signs: Temp: 99.7 F (37.6 C) (05/18 0507) BP: 91/55 mmHg (05/18 0918) Pulse Rate: 97 (05/18 0507)  Labs:  Recent Labs  11/05/13 1833 11/06/13 0615 11/07/13 0640  HGB 14.2 13.0 11.0*  HCT 43.0 39.7 33.5*  PLT 120* 121* 85*  APTT 30  --   --   LABPROT 12.4  --  14.7  INR 0.94  --  1.17  CREATININE 1.05 1.10 1.21*    CrCl is unknown because there is no height on file for the current visit.   Medical History: Past Medical History  Diagnosis Date  . Hypertension   . Anxiety   . Depression   . COPD (chronic obstructive pulmonary disease)     Medications:  Prescriptions prior to admission  Medication Sig Dispense Refill  . atorvastatin (LIPITOR) 80 MG tablet Take 80 mg by mouth at bedtime.      . clonazePAM (KLONOPIN) 1 MG tablet Take 1 mg by mouth 3 (three) times daily.      Marland Kitchen levothyroxine (SYNTHROID, LEVOTHROID) 88 MCG tablet Take 88 mcg by mouth daily before breakfast.      . lisinopril (PRINIVIL,ZESTRIL) 30 MG tablet Take 30 mg by mouth daily.      . sertraline (ZOLOFT) 100 MG tablet Take 150 mg by mouth daily.        Assessment: 89 yof s/p fall w/ broken hip, now s/p hip arthroplasty on 5.17. on warfarin for VTE prophylaxis. INR 1.17, trending up slowly. Hgb 13 > 11, plt 121 > 85.  Goal of Therapy:  INR 2-3   Plan:  1. Warfarin 5mg  PO x 1 tonight 2. Daily INR, and watch pltc closely 3. Coumadin education with pharmacist  Maryanna Shape, PharmD, BCPS  Clinical Pharmacist  Pager: [phone]   11/07/2013,11:30 AM

## 2013-11-07 NOTE — Progress Notes (Signed)
Utilization review completed.  

## 2013-11-08 ENCOUNTER — Encounter (HOSPITAL_COMMUNITY): Payer: Self-pay | Admitting: Orthopedic Surgery

## 2013-11-08 ENCOUNTER — Inpatient Hospital Stay (HOSPITAL_COMMUNITY): Payer: Medicare Other

## 2013-11-08 LAB — URINALYSIS, ROUTINE W REFLEX MICROSCOPIC
BILIRUBIN URINE: NEGATIVE
GLUCOSE, UA: NEGATIVE mg/dL
KETONES UR: NEGATIVE mg/dL
Leukocytes, UA: NEGATIVE
Nitrite: NEGATIVE
PROTEIN: 30 mg/dL — AB
Specific Gravity, Urine: 1.019 (ref 1.005–1.030)
Urobilinogen, UA: 0.2 mg/dL (ref 0.0–1.0)
pH: 5 (ref 5.0–8.0)

## 2013-11-08 LAB — CBC
HEMATOCRIT: 30.2 % — AB (ref 36.0–46.0)
Hemoglobin: 10 g/dL — ABNORMAL LOW (ref 12.0–15.0)
MCH: 29.6 pg (ref 26.0–34.0)
MCHC: 33.1 g/dL (ref 30.0–36.0)
MCV: 89.3 fL (ref 78.0–100.0)
Platelets: 88 10*3/uL — ABNORMAL LOW (ref 150–400)
RBC: 3.38 MIL/uL — ABNORMAL LOW (ref 3.87–5.11)
RDW: 14.9 % (ref 11.5–15.5)
WBC: 4.9 10*3/uL (ref 4.0–10.5)

## 2013-11-08 LAB — BASIC METABOLIC PANEL
BUN: 19 mg/dL (ref 6–23)
CHLORIDE: 101 meq/L (ref 96–112)
CO2: 23 mEq/L (ref 19–32)
Calcium: 7.7 mg/dL — ABNORMAL LOW (ref 8.4–10.5)
Creatinine, Ser: 1.16 mg/dL — ABNORMAL HIGH (ref 0.50–1.10)
GFR calc non Af Amer: 43 mL/min — ABNORMAL LOW (ref >90)
GFR, EST AFRICAN AMERICAN: 50 mL/min — AB (ref >90)
GLUCOSE: 117 mg/dL — AB (ref 70–99)
Potassium: 3.9 mEq/L (ref 3.7–5.3)
Sodium: 133 mEq/L — ABNORMAL LOW (ref 137–147)

## 2013-11-08 LAB — URINE MICROSCOPIC-ADD ON

## 2013-11-08 LAB — PROTIME-INR
INR: 1.41 (ref 0.00–1.49)
Prothrombin Time: 16.9 seconds — ABNORMAL HIGH (ref 11.6–15.2)

## 2013-11-08 LAB — GLUCOSE, CAPILLARY: GLUCOSE-CAPILLARY: 120 mg/dL — AB (ref 70–99)

## 2013-11-08 MED ORDER — LEVOFLOXACIN IN D5W 750 MG/150ML IV SOLN: 750.0000 mg | INTRAVENOUS | Status: DC

## 2013-11-08 MED ORDER — LEVOFLOXACIN IN D5W 750 MG/150ML IV SOLN
750.0000 mg | INTRAVENOUS | Status: AC
  Administered 2013-11-08: 750 mg via INTRAVENOUS
  Filled 2013-11-08: qty 150

## 2013-11-08 MED ORDER — WARFARIN SODIUM 5 MG PO TABS
5.0000 mg | ORAL_TABLET | Freq: Once | ORAL | Status: AC
  Administered 2013-11-08: 5 mg via ORAL
  Filled 2013-11-08: qty 1

## 2013-11-08 NOTE — Progress Notes (Signed)
ANTICOAGULATION CONSULT NOTE - Initial Consult  Pharmacy Consult for warfarin Indication: VTE prophylaxis  No Known Allergies  Patient Measurements: Height: 5\' 4"  (162.6 cm) Weight: 140 lb (63.504 kg) (bedscale) IBW/kg (Calculated) : 54.7  Vital Signs: Temp: 99.3 F (37.4 C) (05/19 0648) Temp src: Oral (05/19 0648) BP: 111/39 mmHg (05/19 0543) Pulse Rate: 94 (05/19 0543)  Labs:  Recent Labs  11/05/13 1833 11/06/13 0615 11/07/13 0640 11/08/13 0458  HGB 14.2 13.0 11.0* 10.0*  HCT 43.0 39.7 33.5* 30.2*  PLT 120* 121* 85* 88*  APTT 30  --   --   --   LABPROT 12.4  --  14.7 16.9*  INR 0.94  --  1.17 1.41  CREATININE 1.05 1.10 1.21* 1.16*    Estimated Creatinine Clearance: 32.8 ml/min (by C-G formula based on Cr of 1.16).   Medical History: Past Medical History  Diagnosis Date  . Hypertension   . Anxiety   . Depression   . COPD (chronic obstructive pulmonary disease)     Medications:  Prescriptions prior to admission  Medication Sig Dispense Refill  . atorvastatin (LIPITOR) 80 MG tablet Take 80 mg by mouth at bedtime.      . clonazePAM (KLONOPIN) 1 MG tablet Take 1 mg by mouth 3 (three) times daily.      Marland Kitchen levothyroxine (SYNTHROID, LEVOTHROID) 88 MCG tablet Take 88 mcg by mouth daily before breakfast.      . lisinopril (PRINIVIL,ZESTRIL) 30 MG tablet Take 30 mg by mouth daily.      . sertraline (ZOLOFT) 100 MG tablet Take 150 mg by mouth daily.        Assessment: 69 yof s/p fall w/ broken hip, now s/p hip arthroplasty on 5/17. on warfarin for VTE prophylaxis. INR 1.17 > 1.41, trending up slowly. Hgb 13 > 10, plt 121 > 88 dropping post surgery, but overall stable from yesterday. No bleeding note. Likely to SNF Tuesday or Wednesday.  Goal of Therapy:  INR 2-3   Plan:  1. Warfarin 5mg  PO x 1 tonight 2. Daily INR, and watch pltc closely 3. Coumadin education with pharmacist  Maryanna Shape, PharmD, BCPS  Clinical Pharmacist  Pager: [phone]   11/08/2013,10:56  AM

## 2013-11-08 NOTE — Progress Notes (Signed)
ANTIBIOTIC CONSULT NOTE - INITIAL  Pharmacy Consult for Levofloxacin Indication: pneumonia  No Known Allergies  Patient Measurements: Height: 5\' 4"  (162.6 cm) Weight: 140 lb (63.504 kg) (bedscale) IBW/kg (Calculated) : 54.7 Adjusted Body Weight:   Vital Signs: Temp: 97.6 F (36.4 C) (05/19 1300) Temp src: Oral (05/19 0648) BP: 122/45 mmHg (05/19 1300) Pulse Rate: 84 (05/19 1300) Intake/Output from previous day: 05/18 0701 - 05/19 0700 In: 2886.3 [P.O.:720; I.V.:2166.3] Out: 400 [Urine:400] Intake/Output from this shift: Total I/O In: 240 [P.O.:240] Out: 250 [Urine:250]  Labs:  Recent Labs  11/06/13 0615 11/07/13 0640 11/08/13 0458  WBC 3.9* 4.7 4.9  HGB 13.0 11.0* 10.0*  PLT 121* 85* 88*  CREATININE 1.10 1.21* 1.16*   Estimated Creatinine Clearance: 32.8 ml/min (by C-G formula based on Cr of 1.16). No results found for this basename: VANCOTROUGH, Corlis Leak, VANCORANDOM, GENTTROUGH, GENTPEAK, GENTRANDOM, TOBRATROUGH, TOBRAPEAK, TOBRARND, AMIKACINPEAK, AMIKACINTROU, AMIKACIN,  in the last 72 hours   Microbiology: Recent Results (from the past 720 hour(s))  SURGICAL PCR SCREEN     Status: Abnormal   Collection Time    11/06/13  5:25 AM      Result Value Ref Range Status   MRSA, PCR NEGATIVE  NEGATIVE Final   Staphylococcus aureus POSITIVE (*) NEGATIVE Final   Comment:            The Xpert SA Assay (FDA     approved for NASAL specimens     in patients over 43 years of age),     is one component of     a comprehensive surveillance     program.  Test performance has     been validated by [employer] for patients greater     than or equal to 22 year old.     It is not intended     to diagnose infection nor to     guide or monitor treatment.    Medical History: Past Medical History  Diagnosis Date  . Hypertension   . Anxiety   . Depression   . COPD (chronic obstructive pulmonary disease)     Medications:  Scheduled:  . atorvastatin  80 mg Oral  q1800  . clonazePAM  1 mg Oral TID  . coumadin book   Does not apply Once  . feeding supplement (ENSURE COMPLETE)  237 mL Oral BID BM  . folic acid  1 mg Oral Daily  . levofloxacin (LEVAQUIN) IV  750 mg Intravenous NOW  . [START ON 11/10/2013] levofloxacin (LEVAQUIN) IV  750 mg Intravenous Q48H  . levothyroxine  88 mcg Oral QAC breakfast  . multivitamin with minerals  1 tablet Oral Daily  . sertraline  150 mg Oral Daily  . sodium chloride  3 mL Intravenous Q12H  . thiamine  100 mg Oral Daily  . warfarin  5 mg Oral ONCE-1800  . warfarin   Does not apply Once  . Warfarin - Pharmacist Dosing Inpatient   Does not apply q1800   Assessment: 80yo female with CAP, Tm 103 this AM.  Calculated CrCl ~62ml/min, requiring dosage adjustment.  Goal of Therapy:  Resolution of infection  Plan:  1-  Levofloxacin 750mg  IV q48, first dose now 2-  Watch renal fxn, adjust dose as indicated 3-  F/U any cx data  Gracy Bruins, [facility]

## 2013-11-08 NOTE — Progress Notes (Addendum)
Enc use of I I/S pt demonstrated offered pt paim med refused talking confused at times as far as time and place.

## 2013-11-08 NOTE — Progress Notes (Addendum)
Clinical Social Work Department CLINICAL SOCIAL WORK PLACEMENT NOTE 11/08/2013  Patient:  Kara Mcdowell, Kara Mcdowell  Account Number:  [id number] Admit date:  11/05/2013  Clinical Social Worker:  Berton Mount, Latanya Presser  Date/time:  11/08/2013 11:45 AM  Clinical Social Work is seeking post-discharge placement for this patient at the following level of care:   SKILLED NURSING   (*CSW will update this form in Epic as items are completed)   11/08/2013  Patient/family provided with [place] Department of Clinical Social Work's list of facilities offering this level of care within the geographic area requested by the patient (or if unable, by the patient's family).  11/08/2013  Patient/family informed of their freedom to choose among providers that offer the needed level of care, that participate in Medicare, Medicaid or managed care program needed by the patient, have an available bed and are willing to accept the patient.  11/08/2013  Patient/family informed of [facility]' ownership interest in [facility], as well as of the fact that they are under no obligation to receive care at this facility.  PASARR submitted to EDS on 11/08/2013 PASARR number received from EDS on   FL2 transmitted to all facilities in geographic area requested by pt/family on  11/08/2013 FL2 transmitted to all facilities within larger geographic area on   Patient informed that his/her managed care company has contracts with or will negotiate with  certain facilities, including the following:     Patient/family informed of bed offers received:  11/08/2013 Patient chooses bed at [facility] center Physician recommends and patient chooses bed at    Patient to be transferred to [place] center on   Patient to be transferred to facility by   The following physician request were entered in Epic:   Additional Comments:  [place], [place]

## 2013-11-08 NOTE — Progress Notes (Signed)
Pt stable ll ok Ready for transfer to snf

## 2013-11-08 NOTE — Progress Notes (Signed)
Clinical Social Work Department BRIEF PSYCHOSOCIAL ASSESSMENT 11/08/2013  Patient:  Kara Mcdowell, Kara Mcdowell     Account Number:  [id number]     Admit date:  11/05/2013  Clinical Social Worker:  Adair Laundry  Date/Time:  11/08/2013 11:00 AM  Referred by:  Physician  Date Referred:  11/08/2013 Referred for  SNF Placement   Other Referral:   Interview type:  Patient Other interview type:    PSYCHOSOCIAL DATA Living Status:  ALONE Admitted from facility:   Level of care:   Primary support name:  Leeanne Deed [phone] Primary support relationship to patient:  FRIEND Degree of support available:   Pt has adequate support    CURRENT CONCERNS Current Concerns  Post-Acute Placement   Other Concerns:    SOCIAL WORK ASSESSMENT / PLAN CSW aware of PT recommendation for SNF. CSW spoke with pt at bedside about recommendation. Pt already aware and is agreeable to [facility]. Pt is [place] resident but receives her care at [facility] and requesting to dc to [facility]. CSW explained SNF referral process. At this time, pt only wanting referal to be sent to requested facility.    CSW has submitted pt PASARR. Pt is [place] resident and will require additional screening.   Assessment/plan status:  Psychosocial Support/Ongoing Assessment of Needs Other assessment/ plan:   Information/referral to community resources:   SNF list to be provided if needed    PATIENT'S/FAMILY'S RESPONSE TO PLAN OF CARE: Pt is agreeable to [facility]       [place], [place]

## 2013-11-08 NOTE — Progress Notes (Signed)
Patient ID: Kara Mcdowell, female   DOB: 1929/11/29, 80 y.o.   MRN: [id number]  [facility] PROGRESS NOTE  Kara Mcdowell NGE:[id number] DOB: [birth year] DOA: 11/05/2013 PCP: Kara Holiday, MD  Brief narrative:  80 y.o. female who is fairly active states that she was at home, after breakfast went outdoors and was trying to swat some bumble bees, She states that her foot got caught on the concrete and she fell on her right side. She states that her right foot was intially numb after the fall but now is better. She states that she had no dizziness and she had no headache. She denies any loss of consciousness. She states that there was no chest pain noted. She has sustained right hip fracture and [facility] asked to admit for further evaluation.   Hold discharge until tomorrow given post op fever with Tmax 103F.   Principal Problem:  Intertrochanteric fracture of right hip  - status post right hip hemiarthroplasty, post op day #2, pt doing well and clinically stable this AM  - appreciate ortho assistance  - Coumadin for DVT prophylaxis  Active Problems:  Post op fever - Tmax 103.1 F, unclear source - will obtain CXR, UA, urine culture, blood culture - hold off on ABX for now until clear source identified - tylenol PRN  Acute blood loss anemia - post op related - Hg trend since admission: 13 --> 11 --> 10 - close monitoring and repeat CBC in AM Thrombocytopenia - drop in Plt since admission - pt is on coumadin so will have to monitor closely - plt trend since admission: 121 --> 85 -->88 Acute renal failure - appears to be pre renal in etiology - will continue IVF and repeat BMP in AM - continue to hold Lisinopril  Essential hypertension, benign  - continue to hold Lisinopril - SBP in 110's, stable ? Lung nodule  - CT chest with contrast unremarkable for acute pathology  - no need for additional follow up Anxiety state, unspecified  - clinically stable, continue clonazepam as needed as  per home medical regimen  COPD exacerbation  - clinically compensated and maintaining oxygen saturation on target range on oxygen via [place]  Unspecified hypothyroidism  - continue synthroid   Consultants:  Ortho  Procedures/Studies:  CXR 11/06/2013 Irregular density in the lingula. ? Neoplasm/area of scarring, chest CT with contrast recommended. COPD. Antibiotics:  None  Code Status: Full  Family Communication: Pt at bedside  Disposition Plan: Possible SNF in 1-2 days  HPI/Subjective: No events overnight.   Objective: Filed Vitals:   11/08/13 0000 11/08/13 0400 11/08/13 0543 11/08/13 0648  BP:   111/39   Pulse:   94   Temp:   103.1 F (39.5 C) 99.3 F (37.4 C)  TempSrc:   Oral Oral  Resp: 16 16 16    Height:      Weight:      SpO2: 98% 98% 94%     Intake/Output Summary (Last 24 hours) at 11/08/13 1102 Last data filed at 11/08/13 0700  Gross per 24 hour  Intake 2646.25 ml  Output    400 ml  Net 2246.25 ml    Exam:   General:  Pt is alert, follows commands appropriately, not in acute distress  Cardiovascular: Regular rate and rhythm, S1/S2, no murmurs, no rubs, no gallops  Respiratory: Clear to auscultation bilaterally, no wheezing, no crackles, no rhonchi  Abdomen: Soft, non tender, non distended, bowel sounds present, no guarding  Data Reviewed: Basic Metabolic Panel:  Recent Labs Lab 11/05/13 1833 11/06/13 0615 11/07/13 0640 11/08/13 0458  NA 140 134* 133* 133*  K 4.1 4.1 3.8 3.9  CL 102 100 98 101  CO2 26 25 25 23   GLUCOSE 117* 102* 110* 117*  BUN 8 13 13 19   CREATININE 1.05 1.10 1.21* 1.16*  CALCIUM 9.2 8.5 7.6* 7.7*  MG 1.8  --   --   --   PHOS 2.5  --   --   --    Liver Function Tests:  Recent Labs Lab 11/05/13 1833 11/06/13 0615  AST 40* 36  ALT 24 22  ALKPHOS 97 84  BILITOT 0.5 0.5  PROT 6.7 6.1  ALBUMIN 3.7 3.2*   CBC:  Recent Labs Lab 11/05/13 1833 11/06/13 0615 11/07/13 0640 11/08/13 0458  WBC 5.1 3.9* 4.7 4.9   NEUTROABS 4.4  --   --   --   HGB 14.2 13.0 11.0* 10.0*  HCT 43.0 39.7 33.5* 30.2*  MCV 92.1 91.1 91.0 89.3  PLT 120* 121* 85* 88*   CBG:  Recent Labs Lab 11/07/13 0825 11/08/13 0807  GLUCAP 139* 120*    Recent Results (from the past 240 hour(s))  SURGICAL PCR SCREEN     Status: Abnormal   Collection Time    11/06/13  5:25 AM      Result Value Ref Range Status   MRSA, PCR NEGATIVE  NEGATIVE Final   Staphylococcus aureus POSITIVE (*) NEGATIVE Final   Comment:            The Xpert SA Assay (FDA     approved for NASAL specimens     in patients over 25 years of age),     is one component of     a comprehensive surveillance     program.  Test performance has     been validated by [employer] for patients greater     than or equal to 53 year old.     It is not intended     to diagnose infection nor to     guide or monitor treatment.     Scheduled Meds: . atorvastatin  80 mg Oral q1800  . clonazePAM  1 mg Oral TID  . coumadin book   Does not apply Once  . feeding supplement (ENSURE COMPLETE)  237 mL Oral BID BM  . folic acid  1 mg Oral Daily  . levothyroxine  88 mcg Oral QAC breakfast  . multivitamin with minerals  1 tablet Oral Daily  . sertraline  150 mg Oral Daily  . sodium chloride  3 mL Intravenous Q12H  . thiamine  100 mg Oral Daily  . warfarin   Does not apply Once  . Warfarin - Pharmacist Dosing Inpatient   Does not apply q1800   Continuous Infusions: . sodium chloride 75 mL/hr at 11/08/13 0033     Iskra Barbera Setters, MD  [facility] Pager [phone]  If 7PM-7AM, please contact night-coverage [email or web address] Password [email or web address] 11/08/2013, 11:02 AM   LOS: 3 days

## 2013-11-09 ENCOUNTER — Inpatient Hospital Stay (HOSPITAL_COMMUNITY): Payer: Medicare Other

## 2013-11-09 DIAGNOSIS — I10 Essential (primary) hypertension: Secondary | ICD-10-CM

## 2013-11-09 DIAGNOSIS — E039 Hypothyroidism, unspecified: Secondary | ICD-10-CM

## 2013-11-09 DIAGNOSIS — S72009A Fracture of unspecified part of neck of unspecified femur, initial encounter for closed fracture: Secondary | ICD-10-CM

## 2013-11-09 LAB — BASIC METABOLIC PANEL
BUN: 16 mg/dL (ref 6–23)
CHLORIDE: 98 meq/L (ref 96–112)
CO2: 25 mEq/L (ref 19–32)
CREATININE: 0.95 mg/dL (ref 0.50–1.10)
Calcium: 8 mg/dL — ABNORMAL LOW (ref 8.4–10.5)
GFR calc Af Amer: 63 mL/min — ABNORMAL LOW (ref >90)
GFR calc non Af Amer: 55 mL/min — ABNORMAL LOW (ref >90)
Glucose, Bld: 113 mg/dL — ABNORMAL HIGH (ref 70–99)
Potassium: 3.9 mEq/L (ref 3.7–5.3)
Sodium: 132 mEq/L — ABNORMAL LOW (ref 137–147)

## 2013-11-09 LAB — CBC
HEMATOCRIT: 28.2 % — AB (ref 36.0–46.0)
Hemoglobin: 9.5 g/dL — ABNORMAL LOW (ref 12.0–15.0)
MCH: 30.1 pg (ref 26.0–34.0)
MCHC: 33.7 g/dL (ref 30.0–36.0)
MCV: 89.2 fL (ref 78.0–100.0)
Platelets: 84 10*3/uL — ABNORMAL LOW (ref 150–400)
RBC: 3.16 MIL/uL — AB (ref 3.87–5.11)
RDW: 14.8 % (ref 11.5–15.5)
WBC: 6 10*3/uL (ref 4.0–10.5)

## 2013-11-09 LAB — LEGIONELLA ANTIGEN, URINE: LEGIONELLA ANTIGEN, URINE: NEGATIVE

## 2013-11-09 LAB — PROTIME-INR
INR: 1.8 — ABNORMAL HIGH (ref 0.00–1.49)
Prothrombin Time: 20.4 seconds — ABNORMAL HIGH (ref 11.6–15.2)

## 2013-11-09 LAB — GLUCOSE, CAPILLARY: Glucose-Capillary: 113 mg/dL — ABNORMAL HIGH (ref 70–99)

## 2013-11-09 MED ORDER — WARFARIN SODIUM 3 MG PO TABS
3.0000 mg | ORAL_TABLET | Freq: Once | ORAL | Status: DC
  Filled 2013-11-09: qty 1

## 2013-11-09 MED ORDER — LEVOFLOXACIN 750 MG PO TABS: 750.0000 mg | ORAL_TABLET | Freq: Every day | ORAL | Status: DC

## 2013-11-09 MED ORDER — ENSURE COMPLETE PO LIQD: 237.0000 mL | Freq: Two times a day (BID) | ORAL | Status: DC

## 2013-11-09 MED ORDER — FOLIC ACID 1 MG PO TABS: 1.0000 mg | ORAL_TABLET | Freq: Every day | ORAL | Status: DC

## 2013-11-09 NOTE — Progress Notes (Signed)
Pt seen this am Doing well Mobilize - snf pending

## 2013-11-09 NOTE — Discharge Summary (Signed)
Physician Discharge Summary  [name] ZOX:[id number] DOB: 12-09-1929 DOA: 11/05/2013  PCP: Betsey Holiday, MD  Admit date: 11/05/2013 Discharge date: 11/09/2013  Time spent: 45 minutes  Recommendations for Outpatient Follow-up:  Patient will be discharged to [facility]. Patient is to continue physical therapy as well as occupational therapy as recommended by the nursing facility. Patient should continue her medications as prescribed. She will need to followup with primary care physician at the nursing home as well as her own primary care physician within one week of discharge. Patient should also follow up with Dr. Marlou Sa, orthopedics within one month of discharge.  Patient should also have a CBC within one week of discharge.  Discharge Diagnoses:  Principal Problem:   Intertrochanteric fracture of right hip Active Problems:   HCAP/ post op fever   Essential hypertension, benign   Anxiety state, unspecified   Depression   COPD exacerbation   Hip fracture, right   Unspecified hypothyroidism   Hip fracture   Acute blood loss anemia   Thrombocytopenia   Acute renal failure   ?Lung nodule  Discharge Condition: Stable  Diet recommendation: Cardiac healthy  Filed Weights   11/07/13 1500  Weight: 63.504 kg (140 lb)    History of present illness:  Kara Mcdowell is a 80 y.o. female who is fairly active states that she was at home after breakfast went outdoors and was trying to swat some bumble bees, She states that her foot got caught on the concrete and she fell on her right side. She states that her right foot was intially numb after the fall but now is better. She states that she had no dizziness and she had no headache. She denies any loss of consciousness. She states that there was no chest pain noted. She states that she had no SOB noted. She has no edema of her legs, She denies any history of prior heart disease other than hypertension.  Hospital Course:    Intertrochanteric fracture of right hip  - status post right hip hemiarthroplasty - Patient will need to follow up with orthopedics will discharge from a nursing home facility.  HCAP/ Post op fever  - Tmax 103.1 F on 05/19, currently patient is afebrile - Chest x-ray showed possible pneumonia, patient was started on Levaquin - UA was negative for any nitrates, leukocytes, showed few bacteria - Blood cultures negative to date.   Acute blood loss anemia  - post op related  - Hemoglobin has trended downward, patient should have CBC within one week at the nursing home   Thrombocytopenia  - drop in platelets since admission - pt is on coumadin so will have to monitor closely  - Patient will need CBC within one week of discharge.  Acute renal failure  - Resolved, appears to be prerenal in etiology  - Creatinine currently 0.95 - Patient was given IVF, and responded well. - Continue to hold her lisinopril  Essential hypertension, benign  - continue to hold Lisinopril  - SBP in 110's, stable   ? Lung nodule  - CT chest with contrast unremarkable for acute pathology  - no need for additional follow up   Anxiety state, unspecified  - clinically stable, continue clonazepam as needed as per home medical regimen   COPD exacerbation  - clinically compensated and maintaining oxygen saturation on target range on oxygen via [place]   Unspecified hypothyroidism  - continue synthroid   Procedures: Arthroplasty bipolar hip, right  Consultations: Orthopedics  Discharge Exam:  Filed Vitals:   11/09/13 0549  BP: 102/42  Pulse: 86  Temp: 98.9 F (37.2 C)  Resp: 16    General: Well developed, well nourished, NAD, appears stated age  HEENT: NCAT, PERRLA, EOMI, Anicteic Sclera, mucous membranes moist.   Neck: Supple, no JVD, no masses  Cardiovascular: S1 S2 auscultated, no rubs, murmurs or gallops. Regular rate and rhythm.  Respiratory: Clear to auscultation bilaterally with equal  chest rise  Abdomen: Soft, nontender, nondistended, + bowel sounds  Extremities: warm dry without cyanosis clubbing or edema  Neuro: AAOx3, no focal deficits.  Skin: Without rashes exudates or nodules  Psych: Normal affect and demeanor with intact judgement and insight  Discharge Instructions      Discharge Instructions   Diet - low sodium heart healthy    Complete by:  As directed      Discharge instructions    Complete by:  As directed   Patient will be discharged to [facility]. Patient is to continue physical therapy as well as occupational therapy as recommended by the nursing facility. Patient should continue her medications as prescribed. She will need to followup with primary care physician at the nursing home as well as her own primary care physician within one week of discharge. Patient should also follow up with Dr. Marlou Sa, orthopedics within one month of discharge.  Patient should also have a CBC within one week of discharge.     Full weight bearing    Complete by:  As directed             Medication List    STOP taking these medications       lisinopril 30 MG tablet  Commonly known as:  PRINIVIL,ZESTRIL      TAKE these medications       atorvastatin 80 MG tablet  Commonly known as:  LIPITOR  Take 80 mg by mouth at bedtime.     clonazePAM 1 MG tablet  Commonly known as:  KLONOPIN  Take 1 mg by mouth 3 (three) times daily.     feeding supplement (ENSURE COMPLETE) Liqd  Take 237 mLs by mouth 2 (two) times daily between meals.     folic acid 1 MG tablet  Commonly known as:  FOLVITE  Take 1 tablet (1 mg total) by mouth daily.     HYDROcodone-acetaminophen 5-325 MG per tablet  Commonly known as:  NORCO  Take 1-2 tablets by mouth every 6 (six) hours as needed for moderate pain.     levofloxacin 750 MG tablet  Commonly known as:  LEVAQUIN  Take 1 tablet (750 mg total) by mouth daily.     levothyroxine 88 MCG tablet  Commonly known as:   SYNTHROID, LEVOTHROID  Take 88 mcg by mouth daily before breakfast.     sertraline 100 MG tablet  Commonly known as:  ZOLOFT  Take 150 mg by mouth daily.     warfarin 5 MG tablet  Commonly known as:  COUMADIN  Take 1 tablet (5 mg total) by mouth daily.       No Known Allergies Follow-up Information   Follow up with Betsey Holiday, MD. Schedule an appointment as soon as possible for a visit in 1 week. [facility] followup)    Specialty:  Internal Medicine   Contact information:   [address] [place] [address] [phone]       Follow up with Meredith Pel, MD. Schedule an appointment as soon as possible for a visit  in 1 month. [facility] followup)    Specialty:  Orthopedic Surgery   Contact information:   [place] [address] [phone]        The results of significant diagnostics from this hospitalization (including imaging, microbiology, ancillary and laboratory) are listed below for reference.    Significant Diagnostic Studies: Ct Chest W Contrast  11/06/2013   CLINICAL DATA:  Fall, hip fracture. Possible left lung mass on prior imaging  EXAM: CT CHEST WITH CONTRAST  TECHNIQUE: Multidetector CT imaging of the chest was performed during intravenous contrast administration.  CONTRAST:  39mL OMNIPAQUE IOHEXOL 300 MG/ML  SOLN  COMPARISON:  DG CHEST 1V PORT dated 11/05/2013  FINDINGS: Mild biapical pleural thickening is noted. Subpleural dependent scarring or atelectasis is present. This likely accounts for the presence of possible abnormality on the prior exam. Dependent bilateral pleural effusions are noted, trace in size. No pneumothorax. No mass, consolidation, or nodule identified.  Heart size is normal. No lymphadenopathy. Trace superior pericardial recess fluid is incidentally noted. Incomplete imaging of the upper abdomen demonstrates normal-appearing adrenal glands. Degenerative changes are partly visualized in the upper lumbar spine.  Remote right posterior tenth, ninth rib fractures are identified.  IMPRESSION: Bilateral trace pleural effusions with areas of subpleural atelectasis or scarring, likely explaining the previously questioned finding. No further specific imaging followup is needed for this benign appearing process.   Electronically Signed   By: Conchita Paris M.D.   On: 11/06/2013 11:46   Dg Pelvis Portable  11/06/2013   CLINICAL DATA:  postop  EXAM: PORTABLE PELVIS 1-2 VIEWS  COMPARISON:  DG HIP COMPLETE 2+V*R* dated 11/05/2013  FINDINGS: Patient is status post total right hip arthroplasty. Hardware native osseous structures are intact. Postsurgical changes about the right hip. Degenerative changes within the lower lumbar spine.  IMPRESSION: Patient is status post total right hip arthroplasty.   Electronically Signed   By: Margaree Mackintosh M.D.   On: 11/06/2013 17:36   Dg Chest Port 1 View  11/08/2013   CLINICAL DATA:  Postop hip.  EXAM: PORTABLE CHEST - 1 VIEW  COMPARISON:  11/05/2013, 11/06/2013  FINDINGS: Heart is upper limits normal in size. There are patchy densities at the lung bases bilaterally most consistent with atelectasis and slightly increased since prior study. Early infiltrates are not excluded. No pulmonary edema identified. No pneumothorax.  IMPRESSION: Increased bibasilar atelectasis. Consider follow-up to exclude infiltrates.   Electronically Signed   By: Shon Hale M.D.   On: 11/08/2013 12:29   Portable Chest 1 View  11/06/2013   CLINICAL DATA:  Preop evaluation.  COPD.  EXAM: PORTABLE CHEST - 1 VIEW  COMPARISON:  None.  FINDINGS: Normal sized heart. There is an irregular area of increased density adjacent to the mid left heart border, obscuring that portion of the heart border. The remainder of the lungs are clear and hyperexpanded. Normal appearing bones.  IMPRESSION: 1. Irregular density in the lingula. This could represent a neoplasm or area of scarring. A chest CT with contrast is recommended. 2.  COPD. These results were called by telephone at the time of interpretation on 11/06/2013 at 2:03 AM to Maudie Mercury, the patient's nurse, who verbally acknowledged these results.   Electronically Signed   By: Enrique Sack M.D.   On: 11/06/2013 02:04    Microbiology: Recent Results (from the past 240 hour(s))  SURGICAL PCR SCREEN     Status: Abnormal   Collection Time    11/06/13  5:25 AM  Result Value Ref Range Status   MRSA, PCR NEGATIVE  NEGATIVE Final   Staphylococcus aureus POSITIVE (*) NEGATIVE Final   Comment:            The Xpert SA Assay (FDA     approved for NASAL specimens     in patients over 64 years of age),     is one component of     a comprehensive surveillance     program.  Test performance has     been validated by [employer] for patients greater     than or equal to 41 year old.     It is not intended     to diagnose infection nor to     guide or monitor treatment.  CULTURE, BLOOD (ROUTINE X 2)     Status: None   Collection Time    11/08/13 12:15 PM      Result Value Ref Range Status   Specimen Description BLOOD RIGHT ARM   Final   Special Requests BOTTLES DRAWN AEROBIC ONLY 6CC   Final   Culture  Setup Time     Final   Value: 11/08/2013 19:26     Performed at [facility]   Culture     Final   Value:        BLOOD CULTURE RECEIVED NO GROWTH TO DATE CULTURE WILL BE HELD FOR 5 DAYS BEFORE ISSUING A FINAL NEGATIVE REPORT     Performed at [facility]   Report Status PENDING   Incomplete  CULTURE, BLOOD (ROUTINE X 2)     Status: None   Collection Time    11/08/13 12:30 PM      Result Value Ref Range Status   Specimen Description BLOOD RIGHT HAND   Final   Special Requests BOTTLES DRAWN AEROBIC ONLY 6CC   Final   Culture  Setup Time     Final   Value: 11/08/2013 19:26     Performed at [facility]   Culture     Final   Value:        BLOOD CULTURE RECEIVED NO GROWTH TO DATE CULTURE WILL BE HELD FOR 5 DAYS BEFORE ISSUING A FINAL  NEGATIVE REPORT     Performed at [facility]   Report Status PENDING   Incomplete     Labs: Basic Metabolic Panel:  Recent Labs Lab 11/05/13 1833 11/06/13 0615 11/07/13 0640 11/08/13 0458 11/09/13 0641  NA 140 134* 133* 133* 132*  K 4.1 4.1 3.8 3.9 3.9  CL 102 100 98 101 98  CO2 26 25 25 23 25   GLUCOSE 117* 102* 110* 117* 113*  BUN 8 13 13 19 16   CREATININE 1.05 1.10 1.21* 1.16* 0.95  CALCIUM 9.2 8.5 7.6* 7.7* 8.0*  MG 1.8  --   --   --   --   PHOS 2.5  --   --   --   --    Liver Function Tests:  Recent Labs Lab 11/05/13 1833 11/06/13 0615  AST 40* 36  ALT 24 22  ALKPHOS 97 84  BILITOT 0.5 0.5  PROT 6.7 6.1  ALBUMIN 3.7 3.2*   No results found for this basename: LIPASE, AMYLASE,  in the last 168 hours No results found for this basename: AMMONIA,  in the last 168 hours CBC:  Recent Labs Lab 11/05/13 1833 11/06/13 0615 11/07/13 0640 11/08/13 0458 11/09/13 0641  WBC 5.1 3.9* 4.7 4.9 6.0  NEUTROABS 4.4  --   --   --   --   HGB 14.2 13.0 11.0* 10.0* 9.5*  HCT 43.0 39.7 33.5* 30.2* 28.2*  MCV 92.1 91.1 91.0 89.3 89.2  PLT 120* 121* 85* 88* 84*   Cardiac Enzymes: No results found for this basename: CKTOTAL, CKMB, CKMBINDEX, TROPONINI,  in the last 168 hours BNP: BNP (last 3 results) No results found for this basename: PROBNP,  in the last 8760 hours CBG:  Recent Labs Lab 11/07/13 0825 11/08/13 0807 11/09/13 0832  GLUCAP 139* 120* 113*       Signed:  Ellen Mayol  [facility] 11/09/2013, 10:41 AM

## 2013-11-09 NOTE — Progress Notes (Addendum)
CSW (Clinical Education officer, museum) spoke with facility and they are requesting pt family contact information. CSW spoke with nurse and pt was able to provide with phone number for her sister Izora Gala [phone]). However, CSW tried this number multiple times and line remains busy. CSW called and spoke with Mardene Celeste at facility. She is willing to continue to try provided phone number and also speak with pt to retrieve needed information. Mardene Celeste confirmed CSW could arrange transport for 2pm. CSW prepared pt dc packet and placed with shadow chart. CSW notified pt and pt nurse of transport. CSW signing off.  [place], [place]

## 2013-11-09 NOTE — Discharge Instructions (Signed)
Hip Fracture (Upper Femoral Fracture) °You have a hip fracture (break in bone). This is a fracture of the upper part of the big bone (femur, thigh bone) between your hip and knee. If your caregiver feels it is a stable fracture, occasionally it can be treated without surgery. Usually these fractures are unstable. This means that the bones will not heal properly without surgery. Surgery is necessary to hold the bones together in a good position where they will heal well. °DIAGNOSIS °A physical exam can determine if a fracture has occurred. X-ray studies are needed to see what type of fracture is present and to look for other injuries. These studies will help your caregiver determine what the best treatment is for you. If there is more than one option, your caregiver can give you the information needed to help you decide on the treatment. °TREATMENT  °The treatment for an unstable fracture is usually surgery. This means using a screw, nail, or rod to hold the bones in place.  °RISKS AND COMPLICATIONS °All surgery is associated with risks. Sometimes the implant may fail. Other complications of surgery include infection or the bones not healing properly. Sometimes the fracture may damage the blood supply to the head of the femur. That portion of bone may die (osteonecrosis or avascular necrosis). Sometimes to avoid this complication, an implant is used which just replaces the ball of the femur (hemi-arthroplasty or prosthetic replacement). Some of the other risks are: °· Excessive bleeding. °· Infection. °· Dislocation if a hemi-arthroplasty or a total hip was inserted. °· Failure to heal properly resulting in an unstable hip. °· Stiffness of hip following repair. °· On occasion, blood may have to be replaced before or during the procedure °LET YOUR CAREGIVERS KNOW ABOUT: °· Allergies. °· Medications taken including herbs, eye drops, over the counter medications, and creams. °· Use of steroids (by mouth or  creams). °· History of bleeding or blood problems. °· History of serious infection. °· Previous problems with anesthetics or novocaine. °· Possibility of pregnancy, if this applies. °· History of blood clots (thrombophlebitis). °· Previous surgery. °· Other health problems. °BEFORE THE PROCEDURE °Before surgery, an IV (intravenous line connected to your vein) may be started. You will be given an anesthetic (medications and gas to make you sleep) or given medications in your back to make you numb from the waist down (spinal anesthetic). °AFTER THE PROCEDURE °After surgery, you will be taken to the recovery area where a nurse will watch your progress. You may have a catheter (a long, narrow, hollow tube) in your bladder that helps you pass your water. Once you're awake, stable, and taking fluids well, you will be returned to your room. You will receive physical therapy and other care until you are doing well and your caregiver feels it is safe for you to be transferred either to home or to an extended care facility. Your activity level will change as your caregiver determines what is best for you. °· You may resume normal diet and activities as directed or allowed. °· Change dressings if necessary or as directed. °· Only take over-the-counter or prescription medicines for pain, discomfort, or fever as directed by your caregiver. °· You may be placed on blood thinners for 4-6 weeks to prevent blood clots. °SEEK IMMEDIATE MEDICAL CARE IF: °· There is swelling of your calf or leg. °· You have shortness of breath or chest pain. °· There is redness, swelling, or increasing pain in the wound. °· There is   pus coming from wound. °· You have an unexplained oral temperature above 102° F (38.9° C). °· There is a foul (bad) smell coming from the wound or dressing. °· There is a breaking open of the wound (edges not staying together) after sutures or staples have been removed. °· There is a marked increase in pain or shortening of  the leg. °· You have severe pain anywhere in the leg. °· There is any change in color or temperature of your leg below the injury. °MAKE SURE YOU:  °· Understand these instructions. °· Will watch your condition. °· Will get help right away if you are not doing well or get worse. °Document Released: 06/09/2005 Document Revised: 09/01/2011 Document Reviewed: 01/19/2013 °Exit[employer]® Patient Information ©2014 [employer]. ° °

## 2013-11-09 NOTE — Progress Notes (Signed)
ANTICOAGULATION CONSULT NOTE - Initial Consult  Pharmacy Consult for warfarin Indication: VTE prophylaxis  No Known Allergies  Patient Measurements: Height: 5\' 4"  (162.6 cm) Weight: 140 lb (63.504 kg) (bedscale) IBW/kg (Calculated) : 54.7  Vital Signs: Temp: 98.9 F (37.2 C) (05/20 0549) BP: 102/42 mmHg (05/20 0549) Pulse Rate: 86 (05/20 0549)  Labs:  Recent Labs  11/07/13 0640 11/08/13 0458 11/09/13 0641  HGB 11.0* 10.0* 9.5*  HCT 33.5* 30.2* 28.2*  PLT 85* 88* 84*  LABPROT 14.7 16.9* 20.4*  INR 1.17 1.41 1.80*  CREATININE 1.21* 1.16* 0.95    Estimated Creatinine Clearance: 40.1 ml/min (by C-G formula based on Cr of 0.95).   Medical History: Past Medical History  Diagnosis Date  . Hypertension   . Anxiety   . Depression   . COPD (chronic obstructive pulmonary disease)     Medications:  Prescriptions prior to admission  Medication Sig Dispense Refill  . atorvastatin (LIPITOR) 80 MG tablet Take 80 mg by mouth at bedtime.      . clonazePAM (KLONOPIN) 1 MG tablet Take 1 mg by mouth 3 (three) times daily.      Marland Kitchen levothyroxine (SYNTHROID, LEVOTHROID) 88 MCG tablet Take 88 mcg by mouth daily before breakfast.      . lisinopril (PRINIVIL,ZESTRIL) 30 MG tablet Take 30 mg by mouth daily.      . sertraline (ZOLOFT) 100 MG tablet Take 150 mg by mouth daily.        Assessment: 51 yof s/p fall w/ broken hip, now s/p hip arthroplasty on 5/17. on warfarin for VTE prophylaxis. INR 1.41 > 1.81, trending up nicely. Hgb 13 >> 9.5, plt 121 >>84 dropping post surgery, but overall stable from yesterday. No bleeding note. Likely to SNF soon  Goal of Therapy:  INR 2-3   Plan:  1. Warfarin 3 mg PO x 1 today. If discharge recommend 5mg  daily from tomorrow with close INR monitoring at SNF 2. Daily INR, and watch pltc closely 3. Coumadin education with pharmacist  Maryanna Shape, PharmD, BCPS  Clinical Pharmacist  Pager: [phone]   11/09/2013,10:48 AM

## 2013-11-11 LAB — URINE CULTURE: Colony Count: 80000

## 2013-11-12 LAB — STREP PNEUMONIAE ANTIBODY SEROTYPES
STREP PNEUMO TYPE 19: 6.8 ug/mL
STREP PNEUMO TYPE 9: 0.8 ug/mL
STREP PNEUMONIAE TYPE 5 ABS: 0.3 ug/mL
STREP PNEUMONIAE TYPE 6B ABS: 9.2 ug/mL
Strep pneumo Type 12: 0.6 ug/mL
Strep pneumo Type 4: 0.3 ug/mL
Strep pneumoniae Type 1 Abs: <0.3 ug/mL
Strep pneumoniae Type 14 Abs: 0.4 ug/mL
Strep pneumoniae Type 18C Abs: 0.8 ug/mL
Strep pneumoniae Type 3 Abs: 0.3 ug/mL
Strep pneumoniae Type 7F Abs: 2.2 ug/mL
Strep pneumoniae Type 8 Abs: 3 ug/mL

## 2013-11-14 LAB — CULTURE, BLOOD (ROUTINE X 2)
CULTURE: NO GROWTH
Culture: NO GROWTH

## 2013-12-16 NOTE — OR Nursing (Signed)
Addendum to scope page 

## 2015-06-26 DIAGNOSIS — F419 Anxiety disorder, unspecified: Secondary | ICD-10-CM | POA: Diagnosis not present

## 2015-06-26 DIAGNOSIS — E785 Hyperlipidemia, unspecified: Secondary | ICD-10-CM | POA: Diagnosis not present

## 2015-06-26 DIAGNOSIS — F17200 Nicotine dependence, unspecified, uncomplicated: Secondary | ICD-10-CM | POA: Diagnosis not present

## 2015-06-26 DIAGNOSIS — Z418 Encounter for other procedures for purposes other than remedying health state: Secondary | ICD-10-CM | POA: Diagnosis not present

## 2015-06-26 DIAGNOSIS — I10 Essential (primary) hypertension: Secondary | ICD-10-CM | POA: Diagnosis not present

## 2015-08-30 DIAGNOSIS — F17200 Nicotine dependence, unspecified, uncomplicated: Secondary | ICD-10-CM | POA: Diagnosis not present

## 2015-08-30 DIAGNOSIS — Z1389 Encounter for screening for other disorder: Secondary | ICD-10-CM | POA: Diagnosis not present

## 2015-08-30 DIAGNOSIS — F329 Major depressive disorder, single episode, unspecified: Secondary | ICD-10-CM | POA: Diagnosis not present

## 2015-08-30 DIAGNOSIS — E785 Hyperlipidemia, unspecified: Secondary | ICD-10-CM | POA: Diagnosis not present

## 2015-08-30 DIAGNOSIS — M1610 Unilateral primary osteoarthritis, unspecified hip: Secondary | ICD-10-CM | POA: Diagnosis not present

## 2015-11-08 DIAGNOSIS — E7800 Pure hypercholesterolemia, unspecified: Secondary | ICD-10-CM | POA: Diagnosis not present

## 2015-11-08 DIAGNOSIS — M159 Polyosteoarthritis, unspecified: Secondary | ICD-10-CM | POA: Diagnosis not present

## 2015-11-08 DIAGNOSIS — I10 Essential (primary) hypertension: Secondary | ICD-10-CM | POA: Diagnosis not present

## 2015-11-30 DIAGNOSIS — E039 Hypothyroidism, unspecified: Secondary | ICD-10-CM | POA: Diagnosis not present

## 2015-11-30 DIAGNOSIS — F329 Major depressive disorder, single episode, unspecified: Secondary | ICD-10-CM | POA: Diagnosis not present

## 2015-11-30 DIAGNOSIS — F17200 Nicotine dependence, unspecified, uncomplicated: Secondary | ICD-10-CM | POA: Diagnosis not present

## 2015-11-30 DIAGNOSIS — J449 Chronic obstructive pulmonary disease, unspecified: Secondary | ICD-10-CM | POA: Diagnosis not present

## 2016-01-27 DIAGNOSIS — Z23 Encounter for immunization: Secondary | ICD-10-CM | POA: Diagnosis not present

## 2016-02-06 DIAGNOSIS — I10 Essential (primary) hypertension: Secondary | ICD-10-CM | POA: Diagnosis not present

## 2016-02-06 DIAGNOSIS — E7800 Pure hypercholesterolemia, unspecified: Secondary | ICD-10-CM | POA: Diagnosis not present

## 2016-02-06 DIAGNOSIS — M159 Polyosteoarthritis, unspecified: Secondary | ICD-10-CM | POA: Diagnosis not present

## 2016-02-29 DIAGNOSIS — J449 Chronic obstructive pulmonary disease, unspecified: Secondary | ICD-10-CM | POA: Diagnosis not present

## 2016-02-29 DIAGNOSIS — F17200 Nicotine dependence, unspecified, uncomplicated: Secondary | ICD-10-CM | POA: Diagnosis not present

## 2016-02-29 DIAGNOSIS — Z299 Encounter for prophylactic measures, unspecified: Secondary | ICD-10-CM | POA: Diagnosis not present

## 2016-02-29 DIAGNOSIS — I10 Essential (primary) hypertension: Secondary | ICD-10-CM | POA: Diagnosis not present

## 2016-03-24 DIAGNOSIS — I10 Essential (primary) hypertension: Secondary | ICD-10-CM | POA: Diagnosis not present

## 2016-03-24 DIAGNOSIS — E7800 Pure hypercholesterolemia, unspecified: Secondary | ICD-10-CM | POA: Diagnosis not present

## 2016-05-02 DIAGNOSIS — R5383 Other fatigue: Secondary | ICD-10-CM | POA: Diagnosis not present

## 2016-05-02 DIAGNOSIS — Z713 Dietary counseling and surveillance: Secondary | ICD-10-CM | POA: Diagnosis not present

## 2016-05-02 DIAGNOSIS — Z299 Encounter for prophylactic measures, unspecified: Secondary | ICD-10-CM | POA: Diagnosis not present

## 2016-05-02 DIAGNOSIS — Z7189 Other specified counseling: Secondary | ICD-10-CM | POA: Diagnosis not present

## 2016-05-02 DIAGNOSIS — E039 Hypothyroidism, unspecified: Secondary | ICD-10-CM | POA: Diagnosis not present

## 2016-05-02 DIAGNOSIS — E785 Hyperlipidemia, unspecified: Secondary | ICD-10-CM | POA: Diagnosis not present

## 2016-05-02 DIAGNOSIS — E559 Vitamin D deficiency, unspecified: Secondary | ICD-10-CM | POA: Diagnosis not present

## 2016-05-02 DIAGNOSIS — Z0000 Encounter for general adult medical examination without abnormal findings: Secondary | ICD-10-CM | POA: Diagnosis not present

## 2016-05-02 DIAGNOSIS — Z1389 Encounter for screening for other disorder: Secondary | ICD-10-CM | POA: Diagnosis not present

## 2016-05-02 DIAGNOSIS — F329 Major depressive disorder, single episode, unspecified: Secondary | ICD-10-CM | POA: Diagnosis not present

## 2016-05-02 DIAGNOSIS — Z1211 Encounter for screening for malignant neoplasm of colon: Secondary | ICD-10-CM | POA: Diagnosis not present

## 2016-05-02 DIAGNOSIS — Z79899 Other long term (current) drug therapy: Secondary | ICD-10-CM | POA: Diagnosis not present

## 2016-05-02 DIAGNOSIS — Z6820 Body mass index (BMI) 20.0-20.9, adult: Secondary | ICD-10-CM | POA: Diagnosis not present

## 2016-06-24 DIAGNOSIS — I10 Essential (primary) hypertension: Secondary | ICD-10-CM | POA: Diagnosis not present

## 2016-06-24 DIAGNOSIS — F329 Major depressive disorder, single episode, unspecified: Secondary | ICD-10-CM | POA: Diagnosis not present

## 2016-06-24 DIAGNOSIS — Z299 Encounter for prophylactic measures, unspecified: Secondary | ICD-10-CM | POA: Diagnosis not present

## 2016-06-24 DIAGNOSIS — J449 Chronic obstructive pulmonary disease, unspecified: Secondary | ICD-10-CM | POA: Diagnosis not present

## 2016-06-24 DIAGNOSIS — F17200 Nicotine dependence, unspecified, uncomplicated: Secondary | ICD-10-CM | POA: Diagnosis not present

## 2016-06-24 DIAGNOSIS — Z6821 Body mass index (BMI) 21.0-21.9, adult: Secondary | ICD-10-CM | POA: Diagnosis not present

## 2016-07-21 DIAGNOSIS — I10 Essential (primary) hypertension: Secondary | ICD-10-CM | POA: Diagnosis not present

## 2016-07-21 DIAGNOSIS — E7800 Pure hypercholesterolemia, unspecified: Secondary | ICD-10-CM | POA: Diagnosis not present

## 2016-08-22 DIAGNOSIS — I10 Essential (primary) hypertension: Secondary | ICD-10-CM | POA: Diagnosis not present

## 2016-08-22 DIAGNOSIS — E7800 Pure hypercholesterolemia, unspecified: Secondary | ICD-10-CM | POA: Diagnosis not present

## 2016-09-18 DIAGNOSIS — Z681 Body mass index (BMI) 19 or less, adult: Secondary | ICD-10-CM | POA: Diagnosis not present

## 2016-09-18 DIAGNOSIS — Z299 Encounter for prophylactic measures, unspecified: Secondary | ICD-10-CM | POA: Diagnosis not present

## 2016-09-18 DIAGNOSIS — E785 Hyperlipidemia, unspecified: Secondary | ICD-10-CM | POA: Diagnosis not present

## 2016-09-18 DIAGNOSIS — F329 Major depressive disorder, single episode, unspecified: Secondary | ICD-10-CM | POA: Diagnosis not present

## 2016-09-18 DIAGNOSIS — J449 Chronic obstructive pulmonary disease, unspecified: Secondary | ICD-10-CM | POA: Diagnosis not present

## 2016-09-18 DIAGNOSIS — I10 Essential (primary) hypertension: Secondary | ICD-10-CM | POA: Diagnosis not present

## 2016-09-26 DIAGNOSIS — I10 Essential (primary) hypertension: Secondary | ICD-10-CM | POA: Diagnosis not present

## 2016-09-26 DIAGNOSIS — E7800 Pure hypercholesterolemia, unspecified: Secondary | ICD-10-CM | POA: Diagnosis not present

## 2016-10-01 DIAGNOSIS — M2012 Hallux valgus (acquired), left foot: Secondary | ICD-10-CM | POA: Diagnosis not present

## 2016-10-01 DIAGNOSIS — I739 Peripheral vascular disease, unspecified: Secondary | ICD-10-CM | POA: Diagnosis not present

## 2016-10-01 DIAGNOSIS — M2011 Hallux valgus (acquired), right foot: Secondary | ICD-10-CM | POA: Diagnosis not present

## 2016-10-01 DIAGNOSIS — M2041 Other hammer toe(s) (acquired), right foot: Secondary | ICD-10-CM | POA: Diagnosis not present

## 2016-10-22 DIAGNOSIS — E7800 Pure hypercholesterolemia, unspecified: Secondary | ICD-10-CM | POA: Diagnosis not present

## 2016-10-22 DIAGNOSIS — I10 Essential (primary) hypertension: Secondary | ICD-10-CM | POA: Diagnosis not present

## 2016-12-15 DIAGNOSIS — J449 Chronic obstructive pulmonary disease, unspecified: Secondary | ICD-10-CM | POA: Diagnosis not present

## 2016-12-15 DIAGNOSIS — Z299 Encounter for prophylactic measures, unspecified: Secondary | ICD-10-CM | POA: Diagnosis not present

## 2016-12-15 DIAGNOSIS — F329 Major depressive disorder, single episode, unspecified: Secondary | ICD-10-CM | POA: Diagnosis not present

## 2016-12-15 DIAGNOSIS — Z6820 Body mass index (BMI) 20.0-20.9, adult: Secondary | ICD-10-CM | POA: Diagnosis not present

## 2016-12-15 DIAGNOSIS — E039 Hypothyroidism, unspecified: Secondary | ICD-10-CM | POA: Diagnosis not present

## 2016-12-15 DIAGNOSIS — F17200 Nicotine dependence, unspecified, uncomplicated: Secondary | ICD-10-CM | POA: Diagnosis not present

## 2016-12-15 DIAGNOSIS — I10 Essential (primary) hypertension: Secondary | ICD-10-CM | POA: Diagnosis not present

## 2016-12-15 DIAGNOSIS — E785 Hyperlipidemia, unspecified: Secondary | ICD-10-CM | POA: Diagnosis not present

## 2016-12-15 DIAGNOSIS — Z713 Dietary counseling and surveillance: Secondary | ICD-10-CM | POA: Diagnosis not present

## 2017-01-15 DIAGNOSIS — M79671 Pain in right foot: Secondary | ICD-10-CM | POA: Diagnosis not present

## 2017-01-15 DIAGNOSIS — B351 Tinea unguium: Secondary | ICD-10-CM | POA: Diagnosis not present

## 2017-01-15 DIAGNOSIS — I739 Peripheral vascular disease, unspecified: Secondary | ICD-10-CM | POA: Diagnosis not present

## 2017-01-15 DIAGNOSIS — L110 Acquired keratosis follicularis: Secondary | ICD-10-CM | POA: Diagnosis not present

## 2017-01-21 DIAGNOSIS — E7800 Pure hypercholesterolemia, unspecified: Secondary | ICD-10-CM | POA: Diagnosis not present

## 2017-01-21 DIAGNOSIS — I10 Essential (primary) hypertension: Secondary | ICD-10-CM | POA: Diagnosis not present

## 2017-03-05 DIAGNOSIS — E7800 Pure hypercholesterolemia, unspecified: Secondary | ICD-10-CM | POA: Diagnosis not present

## 2017-03-05 DIAGNOSIS — I10 Essential (primary) hypertension: Secondary | ICD-10-CM | POA: Diagnosis not present

## 2017-03-13 DIAGNOSIS — Z1389 Encounter for screening for other disorder: Secondary | ICD-10-CM | POA: Diagnosis not present

## 2017-03-13 DIAGNOSIS — Z6820 Body mass index (BMI) 20.0-20.9, adult: Secondary | ICD-10-CM | POA: Diagnosis not present

## 2017-03-13 DIAGNOSIS — F329 Major depressive disorder, single episode, unspecified: Secondary | ICD-10-CM | POA: Diagnosis not present

## 2017-03-13 DIAGNOSIS — E039 Hypothyroidism, unspecified: Secondary | ICD-10-CM | POA: Diagnosis not present

## 2017-03-13 DIAGNOSIS — E785 Hyperlipidemia, unspecified: Secondary | ICD-10-CM | POA: Diagnosis not present

## 2017-03-13 DIAGNOSIS — J449 Chronic obstructive pulmonary disease, unspecified: Secondary | ICD-10-CM | POA: Diagnosis not present

## 2017-03-13 DIAGNOSIS — F17200 Nicotine dependence, unspecified, uncomplicated: Secondary | ICD-10-CM | POA: Diagnosis not present

## 2017-03-13 DIAGNOSIS — I10 Essential (primary) hypertension: Secondary | ICD-10-CM | POA: Diagnosis not present

## 2017-03-13 DIAGNOSIS — F419 Anxiety disorder, unspecified: Secondary | ICD-10-CM | POA: Diagnosis not present

## 2017-03-13 DIAGNOSIS — Z299 Encounter for prophylactic measures, unspecified: Secondary | ICD-10-CM | POA: Diagnosis not present

## 2017-04-16 DIAGNOSIS — L110 Acquired keratosis follicularis: Secondary | ICD-10-CM | POA: Diagnosis not present

## 2017-04-16 DIAGNOSIS — M79671 Pain in right foot: Secondary | ICD-10-CM | POA: Diagnosis not present

## 2017-04-16 DIAGNOSIS — I739 Peripheral vascular disease, unspecified: Secondary | ICD-10-CM | POA: Diagnosis not present

## 2017-04-16 DIAGNOSIS — M79672 Pain in left foot: Secondary | ICD-10-CM | POA: Diagnosis not present

## 2017-06-02 DIAGNOSIS — Z299 Encounter for prophylactic measures, unspecified: Secondary | ICD-10-CM | POA: Diagnosis not present

## 2017-06-02 DIAGNOSIS — Z6820 Body mass index (BMI) 20.0-20.9, adult: Secondary | ICD-10-CM | POA: Diagnosis not present

## 2017-06-02 DIAGNOSIS — Z79899 Other long term (current) drug therapy: Secondary | ICD-10-CM | POA: Diagnosis not present

## 2017-06-02 DIAGNOSIS — E785 Hyperlipidemia, unspecified: Secondary | ICD-10-CM | POA: Diagnosis not present

## 2017-06-02 DIAGNOSIS — Z0000 Encounter for general adult medical examination without abnormal findings: Secondary | ICD-10-CM | POA: Diagnosis not present

## 2017-06-02 DIAGNOSIS — E039 Hypothyroidism, unspecified: Secondary | ICD-10-CM | POA: Diagnosis not present

## 2017-06-02 DIAGNOSIS — F419 Anxiety disorder, unspecified: Secondary | ICD-10-CM | POA: Diagnosis not present

## 2017-06-02 DIAGNOSIS — Z1339 Encounter for screening examination for other mental health and behavioral disorders: Secondary | ICD-10-CM | POA: Diagnosis not present

## 2017-06-02 DIAGNOSIS — Z1331 Encounter for screening for depression: Secondary | ICD-10-CM | POA: Diagnosis not present

## 2017-06-02 DIAGNOSIS — Z7189 Other specified counseling: Secondary | ICD-10-CM | POA: Diagnosis not present

## 2017-06-02 DIAGNOSIS — R5383 Other fatigue: Secondary | ICD-10-CM | POA: Diagnosis not present

## 2017-06-25 DIAGNOSIS — L89893 Pressure ulcer of other site, stage 3: Secondary | ICD-10-CM | POA: Diagnosis not present

## 2017-06-25 DIAGNOSIS — I739 Peripheral vascular disease, unspecified: Secondary | ICD-10-CM | POA: Diagnosis not present

## 2017-07-09 DIAGNOSIS — I739 Peripheral vascular disease, unspecified: Secondary | ICD-10-CM | POA: Diagnosis not present

## 2017-07-09 DIAGNOSIS — L89892 Pressure ulcer of other site, stage 2: Secondary | ICD-10-CM | POA: Diagnosis not present

## 2017-08-31 DIAGNOSIS — J449 Chronic obstructive pulmonary disease, unspecified: Secondary | ICD-10-CM | POA: Diagnosis not present

## 2017-08-31 DIAGNOSIS — F419 Anxiety disorder, unspecified: Secondary | ICD-10-CM | POA: Diagnosis not present

## 2017-08-31 DIAGNOSIS — I10 Essential (primary) hypertension: Secondary | ICD-10-CM | POA: Diagnosis not present

## 2017-08-31 DIAGNOSIS — F17200 Nicotine dependence, unspecified, uncomplicated: Secondary | ICD-10-CM | POA: Diagnosis not present

## 2017-08-31 DIAGNOSIS — E785 Hyperlipidemia, unspecified: Secondary | ICD-10-CM | POA: Diagnosis not present

## 2017-08-31 DIAGNOSIS — Z6821 Body mass index (BMI) 21.0-21.9, adult: Secondary | ICD-10-CM | POA: Diagnosis not present

## 2017-08-31 DIAGNOSIS — Z299 Encounter for prophylactic measures, unspecified: Secondary | ICD-10-CM | POA: Diagnosis not present

## 2017-08-31 DIAGNOSIS — E039 Hypothyroidism, unspecified: Secondary | ICD-10-CM | POA: Diagnosis not present

## 2017-09-03 DIAGNOSIS — L110 Acquired keratosis follicularis: Secondary | ICD-10-CM | POA: Diagnosis not present

## 2017-09-03 DIAGNOSIS — M79671 Pain in right foot: Secondary | ICD-10-CM | POA: Diagnosis not present

## 2017-09-03 DIAGNOSIS — M79672 Pain in left foot: Secondary | ICD-10-CM | POA: Diagnosis not present

## 2017-09-03 DIAGNOSIS — I739 Peripheral vascular disease, unspecified: Secondary | ICD-10-CM | POA: Diagnosis not present

## 2017-10-14 DIAGNOSIS — M79675 Pain in left toe(s): Secondary | ICD-10-CM | POA: Diagnosis not present

## 2017-10-14 DIAGNOSIS — L03032 Cellulitis of left toe: Secondary | ICD-10-CM | POA: Diagnosis not present

## 2017-10-14 DIAGNOSIS — M79672 Pain in left foot: Secondary | ICD-10-CM | POA: Diagnosis not present

## 2017-10-28 DIAGNOSIS — M79672 Pain in left foot: Secondary | ICD-10-CM | POA: Diagnosis not present

## 2017-10-28 DIAGNOSIS — M79671 Pain in right foot: Secondary | ICD-10-CM | POA: Diagnosis not present

## 2017-10-28 DIAGNOSIS — I739 Peripheral vascular disease, unspecified: Secondary | ICD-10-CM | POA: Diagnosis not present

## 2017-10-28 DIAGNOSIS — L110 Acquired keratosis follicularis: Secondary | ICD-10-CM | POA: Diagnosis not present

## 2017-11-09 DIAGNOSIS — F419 Anxiety disorder, unspecified: Secondary | ICD-10-CM | POA: Diagnosis not present

## 2017-11-09 DIAGNOSIS — Z6820 Body mass index (BMI) 20.0-20.9, adult: Secondary | ICD-10-CM | POA: Diagnosis not present

## 2017-11-09 DIAGNOSIS — L259 Unspecified contact dermatitis, unspecified cause: Secondary | ICD-10-CM | POA: Diagnosis not present

## 2017-11-09 DIAGNOSIS — Z299 Encounter for prophylactic measures, unspecified: Secondary | ICD-10-CM | POA: Diagnosis not present

## 2017-11-09 DIAGNOSIS — F17210 Nicotine dependence, cigarettes, uncomplicated: Secondary | ICD-10-CM | POA: Diagnosis not present

## 2017-11-09 DIAGNOSIS — J449 Chronic obstructive pulmonary disease, unspecified: Secondary | ICD-10-CM | POA: Diagnosis not present

## 2017-11-09 DIAGNOSIS — I10 Essential (primary) hypertension: Secondary | ICD-10-CM | POA: Diagnosis not present

## 2017-12-01 DIAGNOSIS — F419 Anxiety disorder, unspecified: Secondary | ICD-10-CM | POA: Diagnosis not present

## 2017-12-01 DIAGNOSIS — I10 Essential (primary) hypertension: Secondary | ICD-10-CM | POA: Diagnosis not present

## 2017-12-01 DIAGNOSIS — E039 Hypothyroidism, unspecified: Secondary | ICD-10-CM | POA: Diagnosis not present

## 2017-12-01 DIAGNOSIS — J449 Chronic obstructive pulmonary disease, unspecified: Secondary | ICD-10-CM | POA: Diagnosis not present

## 2017-12-01 DIAGNOSIS — Z681 Body mass index (BMI) 19 or less, adult: Secondary | ICD-10-CM | POA: Diagnosis not present

## 2017-12-01 DIAGNOSIS — Z299 Encounter for prophylactic measures, unspecified: Secondary | ICD-10-CM | POA: Diagnosis not present

## 2018-01-20 DIAGNOSIS — I739 Peripheral vascular disease, unspecified: Secondary | ICD-10-CM | POA: Diagnosis not present

## 2018-01-20 DIAGNOSIS — M79671 Pain in right foot: Secondary | ICD-10-CM | POA: Diagnosis not present

## 2018-01-20 DIAGNOSIS — L110 Acquired keratosis follicularis: Secondary | ICD-10-CM | POA: Diagnosis not present

## 2018-01-20 DIAGNOSIS — M79672 Pain in left foot: Secondary | ICD-10-CM | POA: Diagnosis not present

## 2018-03-04 DIAGNOSIS — F17210 Nicotine dependence, cigarettes, uncomplicated: Secondary | ICD-10-CM | POA: Diagnosis not present

## 2018-03-04 DIAGNOSIS — F419 Anxiety disorder, unspecified: Secondary | ICD-10-CM | POA: Diagnosis not present

## 2018-03-04 DIAGNOSIS — I10 Essential (primary) hypertension: Secondary | ICD-10-CM | POA: Diagnosis not present

## 2018-03-04 DIAGNOSIS — Z681 Body mass index (BMI) 19 or less, adult: Secondary | ICD-10-CM | POA: Diagnosis not present

## 2018-03-04 DIAGNOSIS — J449 Chronic obstructive pulmonary disease, unspecified: Secondary | ICD-10-CM | POA: Diagnosis not present

## 2018-03-04 DIAGNOSIS — Z299 Encounter for prophylactic measures, unspecified: Secondary | ICD-10-CM | POA: Diagnosis not present

## 2018-03-04 DIAGNOSIS — F17200 Nicotine dependence, unspecified, uncomplicated: Secondary | ICD-10-CM | POA: Diagnosis not present

## 2018-03-17 DIAGNOSIS — H2511 Age-related nuclear cataract, right eye: Secondary | ICD-10-CM | POA: Diagnosis not present

## 2018-03-31 DIAGNOSIS — I739 Peripheral vascular disease, unspecified: Secondary | ICD-10-CM | POA: Diagnosis not present

## 2018-03-31 DIAGNOSIS — M79671 Pain in right foot: Secondary | ICD-10-CM | POA: Diagnosis not present

## 2018-03-31 DIAGNOSIS — L110 Acquired keratosis follicularis: Secondary | ICD-10-CM | POA: Diagnosis not present

## 2018-03-31 DIAGNOSIS — M79672 Pain in left foot: Secondary | ICD-10-CM | POA: Diagnosis not present

## 2018-05-05 DIAGNOSIS — H2511 Age-related nuclear cataract, right eye: Secondary | ICD-10-CM | POA: Diagnosis not present

## 2018-05-05 DIAGNOSIS — H02834 Dermatochalasis of left upper eyelid: Secondary | ICD-10-CM | POA: Diagnosis not present

## 2018-05-05 DIAGNOSIS — H02831 Dermatochalasis of right upper eyelid: Secondary | ICD-10-CM | POA: Diagnosis not present

## 2018-05-05 DIAGNOSIS — H26492 Other secondary cataract, left eye: Secondary | ICD-10-CM | POA: Diagnosis not present

## 2018-06-01 DIAGNOSIS — H25011 Cortical age-related cataract, right eye: Secondary | ICD-10-CM | POA: Diagnosis not present

## 2018-06-01 DIAGNOSIS — H2511 Age-related nuclear cataract, right eye: Secondary | ICD-10-CM | POA: Diagnosis not present

## 2018-06-03 DIAGNOSIS — Z299 Encounter for prophylactic measures, unspecified: Secondary | ICD-10-CM | POA: Diagnosis not present

## 2018-06-03 DIAGNOSIS — E039 Hypothyroidism, unspecified: Secondary | ICD-10-CM | POA: Diagnosis not present

## 2018-06-03 DIAGNOSIS — Z6820 Body mass index (BMI) 20.0-20.9, adult: Secondary | ICD-10-CM | POA: Diagnosis not present

## 2018-06-03 DIAGNOSIS — Z79899 Other long term (current) drug therapy: Secondary | ICD-10-CM | POA: Diagnosis not present

## 2018-06-03 DIAGNOSIS — Z1339 Encounter for screening examination for other mental health and behavioral disorders: Secondary | ICD-10-CM | POA: Diagnosis not present

## 2018-06-03 DIAGNOSIS — Z7189 Other specified counseling: Secondary | ICD-10-CM | POA: Diagnosis not present

## 2018-06-03 DIAGNOSIS — E785 Hyperlipidemia, unspecified: Secondary | ICD-10-CM | POA: Diagnosis not present

## 2018-06-03 DIAGNOSIS — Z1331 Encounter for screening for depression: Secondary | ICD-10-CM | POA: Diagnosis not present

## 2018-06-03 DIAGNOSIS — Z0000 Encounter for general adult medical examination without abnormal findings: Secondary | ICD-10-CM | POA: Diagnosis not present

## 2018-06-03 DIAGNOSIS — R5383 Other fatigue: Secondary | ICD-10-CM | POA: Diagnosis not present

## 2018-06-14 DIAGNOSIS — Z961 Presence of intraocular lens: Secondary | ICD-10-CM | POA: Diagnosis not present

## 2018-06-30 DIAGNOSIS — Z961 Presence of intraocular lens: Secondary | ICD-10-CM | POA: Diagnosis not present

## 2018-07-16 DIAGNOSIS — I10 Essential (primary) hypertension: Secondary | ICD-10-CM | POA: Diagnosis not present

## 2018-07-16 DIAGNOSIS — Z6820 Body mass index (BMI) 20.0-20.9, adult: Secondary | ICD-10-CM | POA: Diagnosis not present

## 2018-07-16 DIAGNOSIS — E039 Hypothyroidism, unspecified: Secondary | ICD-10-CM | POA: Diagnosis not present

## 2018-07-16 DIAGNOSIS — J449 Chronic obstructive pulmonary disease, unspecified: Secondary | ICD-10-CM | POA: Diagnosis not present

## 2018-07-16 DIAGNOSIS — F17210 Nicotine dependence, cigarettes, uncomplicated: Secondary | ICD-10-CM | POA: Diagnosis not present

## 2018-07-16 DIAGNOSIS — Z299 Encounter for prophylactic measures, unspecified: Secondary | ICD-10-CM | POA: Diagnosis not present

## 2018-09-02 DIAGNOSIS — F419 Anxiety disorder, unspecified: Secondary | ICD-10-CM | POA: Diagnosis not present

## 2018-09-02 DIAGNOSIS — Z299 Encounter for prophylactic measures, unspecified: Secondary | ICD-10-CM | POA: Diagnosis not present

## 2018-09-02 DIAGNOSIS — I10 Essential (primary) hypertension: Secondary | ICD-10-CM | POA: Diagnosis not present

## 2018-09-02 DIAGNOSIS — J449 Chronic obstructive pulmonary disease, unspecified: Secondary | ICD-10-CM | POA: Diagnosis not present

## 2018-09-02 DIAGNOSIS — Z6820 Body mass index (BMI) 20.0-20.9, adult: Secondary | ICD-10-CM | POA: Diagnosis not present

## 2018-09-02 DIAGNOSIS — F17210 Nicotine dependence, cigarettes, uncomplicated: Secondary | ICD-10-CM | POA: Diagnosis not present

## 2018-12-03 DIAGNOSIS — I10 Essential (primary) hypertension: Secondary | ICD-10-CM | POA: Diagnosis not present

## 2018-12-03 DIAGNOSIS — Z713 Dietary counseling and surveillance: Secondary | ICD-10-CM | POA: Diagnosis not present

## 2018-12-03 DIAGNOSIS — F419 Anxiety disorder, unspecified: Secondary | ICD-10-CM | POA: Diagnosis not present

## 2018-12-03 DIAGNOSIS — Z681 Body mass index (BMI) 19 or less, adult: Secondary | ICD-10-CM | POA: Diagnosis not present

## 2018-12-03 DIAGNOSIS — Z299 Encounter for prophylactic measures, unspecified: Secondary | ICD-10-CM | POA: Diagnosis not present

## 2019-03-16 DIAGNOSIS — F419 Anxiety disorder, unspecified: Secondary | ICD-10-CM | POA: Diagnosis not present

## 2019-03-16 DIAGNOSIS — I10 Essential (primary) hypertension: Secondary | ICD-10-CM | POA: Diagnosis not present

## 2019-03-16 DIAGNOSIS — Z681 Body mass index (BMI) 19 or less, adult: Secondary | ICD-10-CM | POA: Diagnosis not present

## 2019-03-16 DIAGNOSIS — Z299 Encounter for prophylactic measures, unspecified: Secondary | ICD-10-CM | POA: Diagnosis not present

## 2019-03-16 DIAGNOSIS — J449 Chronic obstructive pulmonary disease, unspecified: Secondary | ICD-10-CM | POA: Diagnosis not present

## 2019-03-16 DIAGNOSIS — E785 Hyperlipidemia, unspecified: Secondary | ICD-10-CM | POA: Diagnosis not present

## 2019-03-16 DIAGNOSIS — E039 Hypothyroidism, unspecified: Secondary | ICD-10-CM | POA: Diagnosis not present

## 2019-04-11 ENCOUNTER — Inpatient Hospital Stay (HOSPITAL_COMMUNITY): Payer: Medicare Other

## 2019-04-11 ENCOUNTER — Inpatient Hospital Stay (HOSPITAL_COMMUNITY)
Admission: EM | Admit: 2019-04-11 | Discharge: 2019-04-19 | DRG: 522 | Disposition: A | Discharge status: Skilled Nursing Facility | Payer: Medicare Other | Source: Other Acute Inpatient Hospital | Attending: Internal Medicine | Admitting: Internal Medicine

## 2019-04-11 DIAGNOSIS — I469 Cardiac arrest, cause unspecified: Secondary | ICD-10-CM | POA: Diagnosis not present

## 2019-04-11 DIAGNOSIS — S72002A Fracture of unspecified part of neck of left femur, initial encounter for closed fracture: Principal | ICD-10-CM | POA: Diagnosis present

## 2019-04-11 DIAGNOSIS — Z20828 Contact with and (suspected) exposure to other viral communicable diseases: Secondary | ICD-10-CM | POA: Diagnosis present

## 2019-04-11 DIAGNOSIS — E785 Hyperlipidemia, unspecified: Secondary | ICD-10-CM | POA: Diagnosis present

## 2019-04-11 DIAGNOSIS — S0990XA Unspecified injury of head, initial encounter: Secondary | ICD-10-CM | POA: Diagnosis not present

## 2019-04-11 DIAGNOSIS — S52572A Other intraarticular fracture of lower end of left radius, initial encounter for closed fracture: Secondary | ICD-10-CM | POA: Diagnosis present

## 2019-04-11 DIAGNOSIS — E559 Vitamin D deficiency, unspecified: Secondary | ICD-10-CM | POA: Diagnosis present

## 2019-04-11 DIAGNOSIS — Z7901 Long term (current) use of anticoagulants: Secondary | ICD-10-CM

## 2019-04-11 DIAGNOSIS — W07XXXA Fall from chair, initial encounter: Secondary | ICD-10-CM | POA: Diagnosis present

## 2019-04-11 DIAGNOSIS — R52 Pain, unspecified: Secondary | ICD-10-CM | POA: Diagnosis not present

## 2019-04-11 DIAGNOSIS — S62102A Fracture of unspecified carpal bone, left wrist, initial encounter for closed fracture: Secondary | ICD-10-CM

## 2019-04-11 DIAGNOSIS — Z7401 Bed confinement status: Secondary | ICD-10-CM | POA: Diagnosis not present

## 2019-04-11 DIAGNOSIS — Z23 Encounter for immunization: Secondary | ICD-10-CM | POA: Diagnosis not present

## 2019-04-11 DIAGNOSIS — M25552 Pain in left hip: Secondary | ICD-10-CM | POA: Diagnosis present

## 2019-04-11 DIAGNOSIS — Z79899 Other long term (current) drug therapy: Secondary | ICD-10-CM

## 2019-04-11 DIAGNOSIS — J449 Chronic obstructive pulmonary disease, unspecified: Secondary | ICD-10-CM | POA: Diagnosis present

## 2019-04-11 DIAGNOSIS — F329 Major depressive disorder, single episode, unspecified: Secondary | ICD-10-CM | POA: Diagnosis not present

## 2019-04-11 DIAGNOSIS — M6281 Muscle weakness (generalized): Secondary | ICD-10-CM | POA: Diagnosis not present

## 2019-04-11 DIAGNOSIS — F419 Anxiety disorder, unspecified: Secondary | ICD-10-CM | POA: Diagnosis not present

## 2019-04-11 DIAGNOSIS — R609 Edema, unspecified: Secondary | ICD-10-CM | POA: Diagnosis not present

## 2019-04-11 DIAGNOSIS — S32591A Other specified fracture of right pubis, initial encounter for closed fracture: Secondary | ICD-10-CM | POA: Diagnosis not present

## 2019-04-11 DIAGNOSIS — F17210 Nicotine dependence, cigarettes, uncomplicated: Secondary | ICD-10-CM | POA: Diagnosis not present

## 2019-04-11 DIAGNOSIS — Y92009 Unspecified place in unspecified non-institutional (private) residence as the place of occurrence of the external cause: Secondary | ICD-10-CM

## 2019-04-11 DIAGNOSIS — F339 Major depressive disorder, recurrent, unspecified: Secondary | ICD-10-CM | POA: Diagnosis not present

## 2019-04-11 DIAGNOSIS — M25532 Pain in left wrist: Secondary | ICD-10-CM | POA: Diagnosis not present

## 2019-04-11 DIAGNOSIS — I10 Essential (primary) hypertension: Secondary | ICD-10-CM | POA: Diagnosis not present

## 2019-04-11 DIAGNOSIS — J984 Other disorders of lung: Secondary | ICD-10-CM | POA: Diagnosis not present

## 2019-04-11 DIAGNOSIS — R41841 Cognitive communication deficit: Secondary | ICD-10-CM | POA: Diagnosis not present

## 2019-04-11 DIAGNOSIS — M2550 Pain in unspecified joint: Secondary | ICD-10-CM | POA: Diagnosis not present

## 2019-04-11 DIAGNOSIS — Z96641 Presence of right artificial hip joint: Secondary | ICD-10-CM | POA: Diagnosis not present

## 2019-04-11 DIAGNOSIS — S52502A Unspecified fracture of the lower end of left radius, initial encounter for closed fracture: Secondary | ICD-10-CM | POA: Diagnosis not present

## 2019-04-11 DIAGNOSIS — R2689 Other abnormalities of gait and mobility: Secondary | ICD-10-CM | POA: Diagnosis not present

## 2019-04-11 DIAGNOSIS — S52612A Displaced fracture of left ulna styloid process, initial encounter for closed fracture: Secondary | ICD-10-CM | POA: Diagnosis not present

## 2019-04-11 DIAGNOSIS — Y93E2 Activity, laundry: Secondary | ICD-10-CM | POA: Diagnosis not present

## 2019-04-11 DIAGNOSIS — M25539 Pain in unspecified wrist: Secondary | ICD-10-CM | POA: Diagnosis not present

## 2019-04-11 DIAGNOSIS — Z96649 Presence of unspecified artificial hip joint: Secondary | ICD-10-CM

## 2019-04-11 DIAGNOSIS — S72002D Fracture of unspecified part of neck of left femur, subsequent encounter for closed fracture with routine healing: Secondary | ICD-10-CM | POA: Diagnosis not present

## 2019-04-11 DIAGNOSIS — K5900 Constipation, unspecified: Secondary | ICD-10-CM

## 2019-04-11 DIAGNOSIS — Z471 Aftercare following joint replacement surgery: Secondary | ICD-10-CM | POA: Diagnosis not present

## 2019-04-11 DIAGNOSIS — E039 Hypothyroidism, unspecified: Secondary | ICD-10-CM | POA: Diagnosis not present

## 2019-04-11 DIAGNOSIS — Z9181 History of falling: Secondary | ICD-10-CM | POA: Diagnosis not present

## 2019-04-11 DIAGNOSIS — S52602A Unspecified fracture of lower end of left ulna, initial encounter for closed fracture: Secondary | ICD-10-CM | POA: Diagnosis not present

## 2019-04-11 DIAGNOSIS — Z96642 Presence of left artificial hip joint: Secondary | ICD-10-CM | POA: Diagnosis not present

## 2019-04-11 DIAGNOSIS — Z01810 Encounter for preprocedural cardiovascular examination: Secondary | ICD-10-CM

## 2019-04-11 DIAGNOSIS — Z79890 Hormone replacement therapy (postmenopausal): Secondary | ICD-10-CM

## 2019-04-11 DIAGNOSIS — W11XXXA Fall on and from ladder, initial encounter: Secondary | ICD-10-CM | POA: Diagnosis not present

## 2019-04-11 DIAGNOSIS — W19XXXA Unspecified fall, initial encounter: Secondary | ICD-10-CM | POA: Diagnosis not present

## 2019-04-11 DIAGNOSIS — S52572D Other intraarticular fracture of lower end of left radius, subsequent encounter for closed fracture with routine healing: Secondary | ICD-10-CM | POA: Diagnosis not present

## 2019-04-11 DIAGNOSIS — R0902 Hypoxemia: Secondary | ICD-10-CM | POA: Diagnosis not present

## 2019-04-11 MED ORDER — METHOCARBAMOL 1000 MG/10ML IJ SOLN
500.0000 mg | Freq: Four times a day (QID) | INTRAVENOUS | Status: DC | PRN
  Filled 2019-04-11: qty 5

## 2019-04-11 MED ORDER — SODIUM CHLORIDE 0.9 % IV SOLN
INTRAVENOUS | Status: AC
  Administered 2019-04-11: via INTRAVENOUS

## 2019-04-11 MED ORDER — MORPHINE SULFATE (PF) 2 MG/ML IV SOLN: 0.5000 mg | INTRAVENOUS | Status: DC | PRN

## 2019-04-11 MED ORDER — METHOCARBAMOL 500 MG PO TABS
500.0000 mg | ORAL_TABLET | Freq: Four times a day (QID) | ORAL | Status: DC | PRN
  Administered 2019-04-12 – 2019-04-13 (×3): 500 mg via ORAL
  Filled 2019-04-11 (×3): qty 1

## 2019-04-11 MED ORDER — HYDROCODONE-ACETAMINOPHEN 5-325 MG PO TABS
1.0000 | ORAL_TABLET | Freq: Four times a day (QID) | ORAL | Status: DC | PRN
  Administered 2019-04-12: 2 via ORAL
  Administered 2019-04-12: 1 via ORAL
  Administered 2019-04-12: 2 via ORAL
  Administered 2019-04-13 (×2): 1 via ORAL
  Administered 2019-04-14 (×2): 2 via ORAL
  Administered 2019-04-15 – 2019-04-19 (×5): 1 via ORAL
  Filled 2019-04-11: qty 2
  Filled 2019-04-11: qty 1
  Filled 2019-04-11 (×2): qty 2
  Filled 2019-04-11 (×3): qty 1
  Filled 2019-04-11: qty 2
  Filled 2019-04-11 (×3): qty 1
  Filled 2019-04-11: qty 2
  Filled 2019-04-11: qty 1

## 2019-04-11 NOTE — H&P (Signed)
Kara Mcdowell SWH:[id number] DOB: [birth year] DOA: 04/11/2019     PCP: Betsey Holiday, MD   Outpatient Specialists:  NONE    Patient arrived to ER on  at   Patient coming from: home Lives alone,        Chief Complaint: fall  HPI: Kara Mcdowell is a 85 y.o. female with medical history significant of HTN, HLD, Hypothyroidism    Presented with  Fall off a stepping stool. She was hanging her curtain after washing them She hit her head.  Acute pain in the left side Brought into emergency department at [facility] Where she was found to have femoral neck fracture on the left and radial ulnar fracture on the left arm.  Given head injury CT head was done which was unremarkable Dr. On verbal for PDX has been consulted requested transfer to [facility] to operate in a.m. Patient not on anticoagulation  Reports no hx of chest pain no SOB She continues to smoke  Infectious risk factors:  Reports none In  ER RAPID COVID TEST in house testing  Pending  No results found for: SARSCOV2NAA   Regarding pertinent Chronic problems:   HTN on lisinopril    Hypothyroidism:  Lab Results  Component Value Date   TSH 0.934 11/05/2013   on synthroid      While in ER:  The following Work up has been ordered so far:  No orders of the defined types were placed in this encounter.   Following Medications were ordered in ER: Medications - No data to display    ER Provider Called:  Orthopedics   Dr.Olin  They Recommend admit to medicine   Will see in AM  Plan to take to OR in AM  Significant initial  Findings:  labs showing: Na 135 K 4.1 BUN 11 Cr 1.04 Glucose 98 Alk Phosph 102 Alb 4.2 INr 1.0 WBC 9.8 Hg 12.5 plt 156  UA WNL   ECG: Ordered Personally reviewed by me showing: HR : 64 Rhythm:  NSR   no evidence of ischemic changes QTC 443     UA  no evidence of UTI         Ordered  CT HEAD   NON acute  CXR -  NON acute  KUB - non acute Left hip -femoral  neck fracture Left arm radial ulnar fracture ED Triage Vitals [04/11/19 2235]  Enc Vitals Group     BP 132/64     Pulse Rate (!) 57     Resp 18     Temp 98.1 F (36.7 C)     Temp Source Oral     SpO2 97 %     Weight      Height      Head Circumference      Peak Flow      Pain Score      Pain Loc      Pain Edu?      Excl. in [place]?   YKZL(93)@       Latest  Blood pressure 132/64, pulse (!) 57, temperature 98.1 F (36.7 C), temperature source Oral, resp. rate 18, SpO2 97 %.     Hospitalist was called for admission for left hip fracture   Review of Systems:    Pertinent positives include: fall hip pain  Constitutional:  No weight loss, night sweats, Fevers, chills, fatigue, weight loss  HEENT:  No headaches, Difficulty swallowing,Tooth/dental problems,Sore throat,  No sneezing, itching, ear ache,  nasal congestion, post nasal drip,  Cardio-vascular:  No chest pain, Orthopnea, PND, anasarca, dizziness, palpitations.no Bilateral lower extremity swelling  GI:  No heartburn, indigestion, abdominal pain, nausea, vomiting, diarrhea, change in bowel habits, loss of appetite, melena, blood in stool, hematemesis Resp:  no shortness of breath at rest. No dyspnea on exertion, No excess mucus, no productive cough, No non-productive cough, No coughing up of blood.No change in color of mucus.No wheezing. Skin:  no rash or lesions. No jaundice GU:  no dysuria, change in color of urine, no urgency or frequency. No straining to urinate.  No flank pain.  Musculoskeletal:  No joint pain or no joint swelling. No decreased range of motion. No back pain.  Psych:  No change in mood or affect. No depression or anxiety. No memory loss.  Neuro: no localizing neurological complaints, no tingling, no weakness, no double vision, no gait abnormality, no slurred speech, no confusion  All systems reviewed and apart from [place] all are negative  Past Medical History:   Past Medical History:   Diagnosis Date  . Anxiety   . COPD (chronic obstructive pulmonary disease)   . Depression   . Hypertension       Past Surgical History:  Procedure Laterality Date  . HIP ARTHROPLASTY Right 11/06/2013   Procedure: ARTHROPLASTY BIPOLAR HIP;  Surgeon: Meredith Pel, MD;  Location: [facility];  Service: Orthopedics;  Laterality: Right;    Social History:  Ambulatory    Independently      reports that she has been smoking cigarettes. She has been smoking about 0.50 packs per day. She does not have any smokeless tobacco history on file. No history on file for alcohol and drug.     Family History:   Family History  Problem Relation Age of Onset  . Hypertension Neg Hx   . Diabetes Neg Hx     Allergies: No Known Allergies   Prior to Admission medications   Medication Sig Start Date End Date Taking? Authorizing Provider  atorvastatin (LIPITOR) 80 MG tablet Take 80 mg by mouth at bedtime.    [provider]  clonazePAM (KLONOPIN) 1 MG tablet Take 1 mg by mouth 3 (three) times daily.    [provider]  feeding supplement, ENSURE COMPLETE, (ENSURE COMPLETE) LIQD Take 237 mLs by mouth 2 (two) times daily between meals. 11/09/13   Cristal Ford, DO  folic acid (FOLVITE) 1 MG tablet Take 1 tablet (1 mg total) by mouth daily. 11/09/13   Cristal Ford, DO  HYDROcodone-acetaminophen (NORCO) 5-325 MG per tablet Take 1-2 tablets by mouth every 6 (six) hours as needed for moderate pain. 11/07/13   Meredith Pel, MD  levofloxacin (LEVAQUIN) 750 MG tablet Take 1 tablet (750 mg total) by mouth daily. 11/09/13   Mikhail, Velta Addison, DO  levothyroxine (SYNTHROID, LEVOTHROID) 88 MCG tablet Take 88 mcg by mouth daily before breakfast.    [provider]  sertraline (ZOLOFT) 100 MG tablet Take 150 mg by mouth daily.    [provider]  warfarin (COUMADIN) 5 MG tablet Take 1 tablet (5 mg total) by mouth daily. 11/07/13   Meredith Pel, MD   Physical  Exam: Blood pressure 132/64, pulse (!) 57, temperature 98.1 F (36.7 C), temperature source Oral, resp. rate 18, SpO2 97 %. 1. General:  in No  Acute distress   Chronically ill -appearing 2. Psychological: Alert and  Oriented 3. Head/ENT:    Dry Mucous Membranes  Head Non traumatic, neck supple                           Poor Dentition 4. SKIN:   decreased Skin turgor,  Skin clean Dry and intact no rash 5. Heart: Regular rate and rhythm no Murmur, no Rub or gallop 6. Lungs:  Clear to auscultation bilaterally, no wheezes or crackles   7. Abdomen: Soft,  non-tender, Non distended bowel sounds present 8. Lower extremities: no clubbing, cyanosis, no  edema 9. Neurologically Grossly intact, moving all 4 extremities equally  10. MSK: Normal range of motion   All other LABS:    No results for input(s): WBC, NEUTROABS, HGB, HCT, MCV, PLT in the last 168 hours.   No results for input(s): NA, K, CL, CO2, GLUCOSE, BUN, CREATININE, CALCIUM, MG, PHOS in the last 168 hours.   No results for input(s): AST, ALT, ALKPHOS, BILITOT, PROT, ALBUMIN in the last 168 hours.     Cultures:    Component Value Date/Time   SDES URINE, RANDOM 11/08/2013 2330   SPECREQUEST NONE 11/08/2013 2330   CULT  11/08/2013 1450    ESCHERICHIA COLI Performed at [facility] 11/09/2013 FINAL 11/08/2013 2330     Radiological Exams on Admission: No results found.  Chart has been reviewed    Assessment/Plan  85 y.o. female with medical history significant of HTN, HLD, Hypothyroidism    Admitted for left femoral neck fracture  Present on Admission: . Closed left hip fracture ([place]) -  - management as per orthopedics,  plan to operate   in  a.m.     Keep nothing by mouth post midnight. Patient   not on anticoagulation or antiplatelet agents   Ordered type and screen, Place Foley, order a vitamin D level  Patient at baseline  able to walk  100 feet    Patient denies  any chest pain or shortness of breath currently and/or with exertion,  CG showing no evidence of acute ischemia  no known history of coronary artery disease,  COPD   Liver failure  CKD  Given advanced age patient is at least moderate  Risk  at this point no furthther cardiac workup is indicated.    . Essential hypertension, benign - restart lisinopril when able to tolerate  . Hypothyroidism - - Check TSH continue home medications at current dose    Other plan as per orders.  DVT prophylaxis:  SCD    Code Status:  FULL CODE  as per patient   I had personally discussed CODE STATUS with patient    Family Communication:   Family not at  Bedside    Disposition Plan:    likely will need placement for rehabilitation                        Would benefit from PT/OT eval prior to DC                    Swallow eval - SLP ordered                   Social Work  consulted                                        Consults called: Orthopedcis Dr. Alvan Dame  Admission status:  ED Disposition    None        inpatient     Expect 2 midnight stay secondary to severity of patient's current illness including     Severe lab/radiological/exam abnormalities including:  Hip fracture   and extensive comorbidities including: . HTn  That are currently affecting medical management.   I expect  patient to be hospitalized for 2 midnights requiring inpatient medical care.  Patient is at high risk for adverse outcome (such as loss of life or disability) if not treated.  Indication for inpatient stay as follows:    severe pain requiring acute inpatient management,  inability to maintain oral hydration    Need for operative/procedural  intervention    Need for   IV fluids,  IV anticoagulation     Level of care     medical floor     Precautions:  No active isolations  PPE: Used by the provider:   P100  eye Goggles,  Gloves       Kara Mcdowell 04/12/2019, 1:23 AM    [facility]     after 2 AM please page floor coverage PA If 7AM-7PM, please contact the day team taking care of the patient using [email or web address]

## 2019-04-12 ENCOUNTER — Inpatient Hospital Stay (HOSPITAL_COMMUNITY): Payer: Medicare Other

## 2019-04-12 ENCOUNTER — Inpatient Hospital Stay (HOSPITAL_COMMUNITY): Payer: Medicare Other | Admitting: Certified Registered"

## 2019-04-12 ENCOUNTER — Encounter (HOSPITAL_COMMUNITY)
Admission: EM | Disposition: A | Discharge status: Skilled Nursing Facility | Payer: Self-pay | Source: Other Acute Inpatient Hospital | Attending: Internal Medicine

## 2019-04-12 ENCOUNTER — Other Ambulatory Visit: Payer: Self-pay

## 2019-04-12 ENCOUNTER — Encounter (HOSPITAL_COMMUNITY): Payer: Self-pay | Admitting: Internal Medicine

## 2019-04-12 HISTORY — PX: HIP ARTHROPLASTY: SHX981

## 2019-04-12 LAB — BASIC METABOLIC PANEL
Anion gap: 7 (ref 5–15)
BUN: 11 mg/dL (ref 8–23)
CO2: 28 mmol/L (ref 22–32)
Calcium: 8.6 mg/dL — ABNORMAL LOW (ref 8.9–10.3)
Chloride: 104 mmol/L (ref 98–111)
Creatinine, Ser: 1.06 mg/dL — ABNORMAL HIGH (ref 0.44–1.00)
GFR calc Af Amer: 55 mL/min — ABNORMAL LOW (ref >60)
GFR calc non Af Amer: 48 mL/min — ABNORMAL LOW (ref >60)
Glucose, Bld: 106 mg/dL — ABNORMAL HIGH (ref 70–99)
Potassium: 4.3 mmol/L (ref 3.5–5.1)
Sodium: 139 mmol/L (ref 135–145)

## 2019-04-12 LAB — CBC
HCT: 41.5 % (ref 36.0–46.0)
Hemoglobin: 12.5 g/dL (ref 12.0–15.0)
MCH: 30 pg (ref 26.0–34.0)
MCHC: 30.1 g/dL (ref 30.0–36.0)
MCV: 99.5 fL (ref 80.0–100.0)
Platelets: 132 10*3/uL — ABNORMAL LOW (ref 150–400)
RBC: 4.17 MIL/uL (ref 3.87–5.11)
RDW: 13.8 % (ref 11.5–15.5)
WBC: 9.1 10*3/uL (ref 4.0–10.5)
nRBC: 0 % (ref 0.0–0.2)

## 2019-04-12 LAB — TYPE AND SCREEN
ABO/RH(D): A NEG
Antibody Screen: NEGATIVE

## 2019-04-12 LAB — ABO/RH: ABO/RH(D): A NEG

## 2019-04-12 LAB — TSH: TSH: 3.47 u[IU]/mL (ref 0.350–4.500)

## 2019-04-12 LAB — PROTIME-INR
INR: 0.9 (ref 0.8–1.2)
Prothrombin Time: 12.5 seconds (ref 11.4–15.2)

## 2019-04-12 LAB — SURGICAL PCR SCREEN
MRSA, PCR: NEGATIVE
Staphylococcus aureus: NEGATIVE

## 2019-04-12 LAB — VITAMIN D 25 HYDROXY (VIT D DEFICIENCY, FRACTURES): Vit D, 25-Hydroxy: 12.27 ng/mL — ABNORMAL LOW (ref 30–100)

## 2019-04-12 LAB — SARS CORONAVIRUS 2 (TAT 6-24 HRS): SARS Coronavirus 2: NEGATIVE

## 2019-04-12 LAB — ALBUMIN: Albumin: 3.7 g/dL (ref 3.5–5.0)

## 2019-04-12 SURGERY — HEMIARTHROPLASTY, HIP, DIRECT ANTERIOR APPROACH, FOR FRACTURE
Anesthesia: General | Site: Hip | Laterality: Left

## 2019-04-12 MED ORDER — INFLUENZA VAC A&B SA ADJ QUAD 0.5 ML IM PRSY
0.5000 mL | PREFILLED_SYRINGE | INTRAMUSCULAR | Status: DC
  Filled 2019-04-12: qty 0.5

## 2019-04-12 MED ORDER — PHENYLEPHRINE HCL (PRESSORS) 10 MG/ML IV SOLN
INTRAVENOUS | Status: AC
  Filled 2019-04-12: qty 1

## 2019-04-12 MED ORDER — PHENYLEPHRINE 40 MCG/ML (10ML) SYRINGE FOR IV PUSH (FOR BLOOD PRESSURE SUPPORT)
PREFILLED_SYRINGE | INTRAVENOUS | Status: AC
  Filled 2019-04-12: qty 20

## 2019-04-12 MED ORDER — ONDANSETRON HCL 4 MG/2ML IJ SOLN: 4.0000 mg | Freq: Once | INTRAMUSCULAR | Status: DC | PRN

## 2019-04-12 MED ORDER — PROPOFOL 10 MG/ML IV BOLUS
INTRAVENOUS | Status: DC | PRN
  Administered 2019-04-12: 90 mg via INTRAVENOUS

## 2019-04-12 MED ORDER — ONDANSETRON HCL 4 MG PO TABS: 4.0000 mg | ORAL_TABLET | Freq: Four times a day (QID) | ORAL | Status: DC | PRN

## 2019-04-12 MED ORDER — CEFAZOLIN SODIUM-DEXTROSE 2-4 GM/100ML-% IV SOLN
2.0000 g | Freq: Four times a day (QID) | INTRAVENOUS | Status: AC
  Administered 2019-04-12 – 2019-04-13 (×2): 2 g via INTRAVENOUS
  Filled 2019-04-12 (×2): qty 100

## 2019-04-12 MED ORDER — ONDANSETRON HCL 4 MG/2ML IJ SOLN
INTRAMUSCULAR | Status: AC
  Filled 2019-04-12: qty 4

## 2019-04-12 MED ORDER — EPHEDRINE 5 MG/ML INJ
INTRAVENOUS | Status: AC
  Filled 2019-04-12: qty 30

## 2019-04-12 MED ORDER — BUPIVACAINE HCL (PF) 0.5 % IJ SOLN
INTRAMUSCULAR | Status: AC
  Filled 2019-04-12: qty 30

## 2019-04-12 MED ORDER — PHENOL 1.4 % MT LIQD: 1.0000 | OROMUCOSAL | Status: DC | PRN

## 2019-04-12 MED ORDER — LACTATED RINGERS IV SOLN
INTRAVENOUS | Status: DC
  Administered 2019-04-12 (×2): via INTRAVENOUS

## 2019-04-12 MED ORDER — DOCUSATE SODIUM 100 MG PO CAPS
100.0000 mg | ORAL_CAPSULE | Freq: Two times a day (BID) | ORAL | Status: DC
  Administered 2019-04-12 – 2019-04-19 (×12): 100 mg via ORAL
  Filled 2019-04-12 (×13): qty 1

## 2019-04-12 MED ORDER — LIDOCAINE 2% (20 MG/ML) 5 ML SYRINGE
INTRAMUSCULAR | Status: DC | PRN
  Administered 2019-04-12: 100 mg via INTRAVENOUS

## 2019-04-12 MED ORDER — DEXAMETHASONE SODIUM PHOSPHATE 10 MG/ML IJ SOLN
INTRAMUSCULAR | Status: AC
  Filled 2019-04-12: qty 2

## 2019-04-12 MED ORDER — FENTANYL CITRATE (PF) 250 MCG/5ML IJ SOLN
INTRAMUSCULAR | Status: AC
  Filled 2019-04-12: qty 5

## 2019-04-12 MED ORDER — PROPOFOL 10 MG/ML IV BOLUS
INTRAVENOUS | Status: AC
  Filled 2019-04-12: qty 60

## 2019-04-12 MED ORDER — SUGAMMADEX SODIUM 200 MG/2ML IV SOLN
INTRAVENOUS | Status: DC | PRN
  Administered 2019-04-12: 250 mg via INTRAVENOUS

## 2019-04-12 MED ORDER — ALBUMIN HUMAN 5 % IV SOLN
INTRAVENOUS | Status: DC | PRN
  Administered 2019-04-12: 16:00:00 via INTRAVENOUS

## 2019-04-12 MED ORDER — MUPIROCIN 2 % EX OINT: 1.0000 "application " | TOPICAL_OINTMENT | Freq: Two times a day (BID) | CUTANEOUS | Status: DC

## 2019-04-12 MED ORDER — LIDOCAINE 2% (20 MG/ML) 5 ML SYRINGE
INTRAMUSCULAR | Status: AC
  Filled 2019-04-12: qty 5

## 2019-04-12 MED ORDER — TRANEXAMIC ACID-NACL 1000-0.7 MG/100ML-% IV SOLN
1000.0000 mg | INTRAVENOUS | Status: AC
  Administered 2019-04-12: 1000 mg via INTRAVENOUS
  Filled 2019-04-12: qty 100

## 2019-04-12 MED ORDER — ASPIRIN EC 325 MG PO TBEC
325.0000 mg | DELAYED_RELEASE_TABLET | Freq: Two times a day (BID) | ORAL | Status: DC
  Administered 2019-04-13: 325 mg via ORAL
  Filled 2019-04-12: qty 1

## 2019-04-12 MED ORDER — CHLORHEXIDINE GLUCONATE 4 % EX LIQD
60.0000 mL | Freq: Once | CUTANEOUS | Status: DC
  Administered 2019-04-12: 4 via TOPICAL

## 2019-04-12 MED ORDER — PNEUMOCOCCAL VAC POLYVALENT 25 MCG/0.5ML IJ INJ
0.5000 mL | INJECTION | INTRAMUSCULAR | Status: DC
  Filled 2019-04-12: qty 0.5

## 2019-04-12 MED ORDER — ONDANSETRON HCL 4 MG/2ML IJ SOLN: 4.0000 mg | Freq: Four times a day (QID) | INTRAMUSCULAR | Status: DC | PRN

## 2019-04-12 MED ORDER — SODIUM CHLORIDE 0.9 % IV SOLN
INTRAVENOUS | Status: DC | PRN
  Administered 2019-04-12: 100 ug/min via INTRAVENOUS

## 2019-04-12 MED ORDER — METOCLOPRAMIDE HCL 5 MG PO TABS: 5.0000 mg | ORAL_TABLET | Freq: Three times a day (TID) | ORAL | Status: DC | PRN

## 2019-04-12 MED ORDER — OXYCODONE HCL 5 MG/5ML PO SOLN: 5.0000 mg | Freq: Once | ORAL | Status: DC | PRN

## 2019-04-12 MED ORDER — SODIUM CHLORIDE 0.9 % IR SOLN
Status: DC | PRN
  Administered 2019-04-12: 1

## 2019-04-12 MED ORDER — OXYCODONE HCL 5 MG PO TABS: 5.0000 mg | ORAL_TABLET | Freq: Once | ORAL | Status: DC | PRN

## 2019-04-12 MED ORDER — FENTANYL CITRATE (PF) 100 MCG/2ML IJ SOLN: 25.0000 ug | INTRAMUSCULAR | Status: DC | PRN

## 2019-04-12 MED ORDER — SUCCINYLCHOLINE CHLORIDE 200 MG/10ML IV SOSY
PREFILLED_SYRINGE | INTRAVENOUS | Status: AC
  Filled 2019-04-12: qty 10

## 2019-04-12 MED ORDER — POVIDONE-IODINE 10 % EX SWAB
2.0000 "application " | Freq: Once | CUTANEOUS | Status: DC
  Administered 2019-04-12: 2 via TOPICAL

## 2019-04-12 MED ORDER — EPHEDRINE SULFATE-NACL 50-0.9 MG/10ML-% IV SOSY
PREFILLED_SYRINGE | INTRAVENOUS | Status: DC | PRN
  Administered 2019-04-12: 10 mg via INTRAVENOUS

## 2019-04-12 MED ORDER — SUCCINYLCHOLINE CHLORIDE 200 MG/10ML IV SOSY
PREFILLED_SYRINGE | INTRAVENOUS | Status: DC | PRN
  Administered 2019-04-12: 140 mg via INTRAVENOUS

## 2019-04-12 MED ORDER — FERROUS SULFATE 325 (65 FE) MG PO TABS
325.0000 mg | ORAL_TABLET | Freq: Three times a day (TID) | ORAL | Status: DC
  Administered 2019-04-14 – 2019-04-19 (×15): 325 mg via ORAL
  Filled 2019-04-12 (×15): qty 1

## 2019-04-12 MED ORDER — ROCURONIUM BROMIDE 10 MG/ML (PF) SYRINGE
PREFILLED_SYRINGE | INTRAVENOUS | Status: DC | PRN
  Administered 2019-04-12: 40 mg via INTRAVENOUS

## 2019-04-12 MED ORDER — METOCLOPRAMIDE HCL 5 MG/ML IJ SOLN: 5.0000 mg | Freq: Three times a day (TID) | INTRAMUSCULAR | Status: DC | PRN

## 2019-04-12 MED ORDER — BUPIVACAINE HCL (PF) 0.25 % IJ SOLN
INTRAMUSCULAR | Status: AC
  Filled 2019-04-12: qty 30

## 2019-04-12 MED ORDER — ROCURONIUM BROMIDE 10 MG/ML (PF) SYRINGE
PREFILLED_SYRINGE | INTRAVENOUS | Status: AC
  Filled 2019-04-12: qty 10

## 2019-04-12 MED ORDER — FENTANYL CITRATE (PF) 250 MCG/5ML IJ SOLN
INTRAMUSCULAR | Status: DC | PRN
  Administered 2019-04-12: 25 ug via INTRAVENOUS

## 2019-04-12 MED ORDER — CEFAZOLIN SODIUM-DEXTROSE 2-4 GM/100ML-% IV SOLN
2.0000 g | INTRAVENOUS | Status: AC
  Administered 2019-04-12: 2 g via INTRAVENOUS
  Filled 2019-04-12: qty 100

## 2019-04-12 MED ORDER — VITAMIN D (ERGOCALCIFEROL) 1.25 MG (50000 UNIT) PO CAPS
50000.0000 [IU] | ORAL_CAPSULE | ORAL | Status: DC
  Administered 2019-04-12: 20:00:00 50000 [IU] via ORAL
  Filled 2019-04-12 (×3): qty 1

## 2019-04-12 MED ORDER — MENTHOL 3 MG MT LOZG: 1.0000 | LOZENGE | OROMUCOSAL | Status: DC | PRN

## 2019-04-12 MED ORDER — PHENYLEPHRINE HCL (PRESSORS) 10 MG/ML IV SOLN
INTRAVENOUS | Status: AC
  Filled 2019-04-12: qty 2

## 2019-04-12 SURGICAL SUPPLY — 48 items
BAG ZIPLOCK 12X15 (MISCELLANEOUS) ×3 IMPLANT
BLADE SAW SGTL 11.0X1.19X90.0M (BLADE) ×2 IMPLANT
BLADE SAW SGTL 18X1.27X75 (BLADE) ×2 IMPLANT
BLADE SAW SGTL 18X1.27X75MM (BLADE) ×1
COVER SURGICAL LIGHT HANDLE (MISCELLANEOUS) ×3 IMPLANT
COVER WAND RF STERILE (DRAPES) ×2 IMPLANT
DERMABOND ADVANCED (GAUZE/BANDAGES/DRESSINGS) ×2
DERMABOND ADVANCED .7 DNX12 (GAUZE/BANDAGES/DRESSINGS) ×1 IMPLANT
DRAPE ORTHO SPLIT 77X108 STRL (DRAPES) ×4
DRAPE POUCH INSTRU U-SHP 10X18 (DRAPES) ×3 IMPLANT
DRAPE SURG 17X11 SM STRL (DRAPES) ×3 IMPLANT
DRAPE SURG ORHT 6 SPLT 77X108 (DRAPES) ×2 IMPLANT
DRAPE U-SHAPE 47X51 STRL (DRAPES) ×3 IMPLANT
DRESSING AQUACEL AG SP 3.5X10 (GAUZE/BANDAGES/DRESSINGS) IMPLANT
DRSG AQUACEL AG ADV 3.5X10 (GAUZE/BANDAGES/DRESSINGS) ×3 IMPLANT
DRSG AQUACEL AG SP 3.5X10 (GAUZE/BANDAGES/DRESSINGS) ×3
DRSG TEGADERM 4X4.75 (GAUZE/BANDAGES/DRESSINGS) ×3 IMPLANT
DURAPREP 26ML APPLICATOR (WOUND CARE) ×3 IMPLANT
ELECT BLADE TIP CTD 4 INCH (ELECTRODE) ×3 IMPLANT
ELECT REM PT RETURN 15FT ADLT (MISCELLANEOUS) ×3 IMPLANT
GLOVE BIOGEL PI IND STRL 7.5 (GLOVE) ×2 IMPLANT
GLOVE BIOGEL PI IND STRL 8.5 (GLOVE) ×1 IMPLANT
GLOVE BIOGEL PI INDICATOR 7.5 (GLOVE) ×4
GLOVE BIOGEL PI INDICATOR 8.5 (GLOVE) ×2
GLOVE ECLIPSE 8.0 STRL XLNG CF (GLOVE) ×6 IMPLANT
GLOVE ORTHO TXT STRL SZ7.5 (GLOVE) ×6 IMPLANT
GOWN STRL REUS W/TWL 2XL LVL3 (GOWN DISPOSABLE) ×3 IMPLANT
GOWN STRL REUS W/TWL LRG LVL3 (GOWN DISPOSABLE) ×3 IMPLANT
HEAD FEM UNIPOLAR 46 OD STRL (Hips) ×2 IMPLANT
KIT BASIN OR (CUSTOM PROCEDURE TRAY) ×3 IMPLANT
KIT TURNOVER KIT A (KITS) ×2 IMPLANT
MANIFOLD NEPTUNE II (INSTRUMENTS) ×3 IMPLANT
PACK TOTAL JOINT (CUSTOM PROCEDURE TRAY) ×3 IMPLANT
PROTECTOR NERVE ULNAR (MISCELLANEOUS) ×3 IMPLANT
SPACER FEM TAPERED +0 12/14 (Hips) ×2 IMPLANT
STEM TRI LOC GRIPTION SZ 4 STD (Hips) IMPLANT
SUT ETHIBOND 2 (SUTURE) IMPLANT
SUT MNCRL AB 4-0 PS2 18 (SUTURE) ×3 IMPLANT
SUT STRATAFIX 0 PDS 27 VIOLET (SUTURE) ×3
SUT STRATAFIX PDS+ 0 24IN (SUTURE) ×2 IMPLANT
SUT VIC AB 1 CT1 36 (SUTURE) ×3 IMPLANT
SUT VIC AB 2-0 CT1 27 (SUTURE) ×4
SUT VIC AB 2-0 CT1 TAPERPNT 27 (SUTURE) ×2 IMPLANT
SUTURE STRATFX 0 PDS 27 VIOLET (SUTURE) ×1 IMPLANT
TOWEL OR 17X26 10 PK STRL BLUE (TOWEL DISPOSABLE) ×6 IMPLANT
TOWEL OR NON WOVEN STRL DISP B (DISPOSABLE) ×3 IMPLANT
TRAY FOLEY MTR SLVR 16FR STAT (SET/KITS/TRAYS/PACK) ×3 IMPLANT
TRI LOC GRIPTION SZ 4 STD (Hips) ×3 IMPLANT

## 2019-04-12 NOTE — H&P (View-Only) (Signed)
Reason for Consult: Left hip fracture, left wrist fracture Referring Physician: Tawanna Solo, MD  Kara Mcdowell is an 85 y.o. female.  HPI: Patient is 85 year old female with history of hypertension, hyperlipidemia, hypothyroidism who presented with a fall from her home after stepping off of a stool.  She fell on her left side.  She was brought to the emergency department at [facility] where she was found to have left femoral neck fracture and left radius/ulnar fracture.  She was transferred to [facility] for surgery.   No other complaints.  History of prior right hip hemiarthroplasty.  Past Medical History:  Diagnosis Date  . Anxiety   . COPD (chronic obstructive pulmonary disease) ([place])   . Depression   . Hypertension     Past Surgical History:  Procedure Laterality Date  . HIP ARTHROPLASTY Right 11/06/2013   Procedure: ARTHROPLASTY BIPOLAR HIP;  Surgeon: Meredith Pel, MD;  Location: [facility];  Service: Orthopedics;  Laterality: Right;    Family History  Problem Relation Age of Onset  . Hypertension Neg Hx   . Diabetes Neg Hx     Social History:  reports that she has been smoking cigarettes. She has been smoking about 0.50 packs per day. She has never used smokeless tobacco. No history on file for alcohol and drug.  Allergies: No Known Allergies  Medications:  I have reviewed the patient's current medications. Scheduled: . [START ON 04/13/2019] influenza vaccine adjuvanted  0.5 mL Intramuscular Tomorrow-1000  . [START ON 04/13/2019] pneumococcal 23 valent vaccine  0.5 mL Intramuscular Tomorrow-1000  . Vitamin D (Ergocalciferol)  50,000 Units Oral Q7 days    Results for orders placed or performed during the hospital encounter of 04/11/19 (from the past 24 hour(s))  Surgical PCR screen     Status: None   Collection Time: 04/12/19 12:46 AM   Specimen: Nasal Mucosa; Nasal Swab  Result Value Ref Range   MRSA, PCR NEGATIVE NEGATIVE   Staphylococcus aureus NEGATIVE NEGATIVE   CBC     Status: Abnormal   Collection Time: 04/12/19  3:27 AM  Result Value Ref Range   WBC 9.1 4.0 - 10.5 K/uL   RBC 4.17 3.87 - 5.11 MIL/uL   Hemoglobin 12.5 12.0 - 15.0 g/dL   HCT 41.5 36.0 - 46.0 %   MCV 99.5 80.0 - 100.0 fL   MCH 30.0 26.0 - 34.0 pg   MCHC 30.1 30.0 - 36.0 g/dL   RDW 13.8 11.5 - 15.5 %   Platelets 132 (L) 150 - 400 K/uL   nRBC 0.0 0.0 - 0.2 %  Basic metabolic panel     Status: Abnormal   Collection Time: 04/12/19  3:27 AM  Result Value Ref Range   Sodium 139 135 - 145 mmol/L   Potassium 4.3 3.5 - 5.1 mmol/L   Chloride 104 98 - 111 mmol/L   CO2 28 22 - 32 mmol/L   Glucose, Bld 106 (H) 70 - 99 mg/dL   BUN 11 8 - 23 mg/dL   Creatinine, Ser 1.06 (H) 0.44 - 1.00 mg/dL   Calcium 8.6 (L) 8.9 - 10.3 mg/dL   GFR calc non Af Amer 48 (L) >60 mL/min   GFR calc Af Amer 55 (L) >60 mL/min   Anion gap 7 5 - 15  Type and screen     Status: None   Collection Time: 04/12/19  3:27 AM  Result Value Ref Range   ABO/RH(D) A NEG    Antibody Screen NEG  Sample Expiration      04/15/2019,2359 Performed at [facility], [place] [address] [place], [place] [address]   VITAMIN D 25 Hydroxy (Vit-D Deficiency, Fractures)     Status: Abnormal   Collection Time: 04/12/19  3:27 AM  Result Value Ref Range   Vit D, 25-Hydroxy 12.27 (L) 30 - 100 ng/mL  Albumin     Status: None   Collection Time: 04/12/19  3:27 AM  Result Value Ref Range   Albumin 3.7 3.5 - 5.0 g/dL  TSH     Status: None   Collection Time: 04/12/19  3:27 AM  Result Value Ref Range   TSH 3.470 0.350 - 4.500 uIU/mL  ABO/Rh     Status: None   Collection Time: 04/12/19  4:00 AM  Result Value Ref Range   ABO/RH(D)      A NEG Performed at [facility], [place] [address], [place], [place] [address]      X-ray: CLINICAL DATA:  Fall.  Left hip fracture.  EXAM: PORTABLE PELVIS 1-2 VIEWS  COMPARISON:  Abdomen 04/11/2019.  FINDINGS: Angulated fracture of the left femoral  neck is noted. Degenerative change lumbar spine and left hip. Total right hip replacement. Pelvic calcifications consistent phleboliths.  IMPRESSION: Angulated fracture of the left femoral neck.   Electronically Signed   By: Marcello Moores  Register  CLINICAL DATA:  Wrist fracture, follow-up  EXAM: LEFT WRIST - 2 VIEW  COMPARISON:  04/11/2019  FINDINGS: In cast views of the left wrist again demonstrate the comminuted intra-articular displaced distal left radial fracture. Continued mild displacement of fracture fragments and slight posterior angulation noted on the lateral view.  IMPRESSION: Distal left radial fracture with continued mild displaced fracture fragments and angulation.  ROS  Nothing acute other than noted in HPI  Blood pressure 135/67, pulse 68, temperature 97.7 F (36.5 C), temperature source Oral, resp. rate 16, height 5\' 4"  (1.626 m), weight 51 kg, SpO2 98 %.  Physical Exam: Awake alert a little disoriented with regards to location perhaps related to medication for transport last night  General exam: Very pleasant elderly female, deconditioned/debilitated  HEENT:PERRL, Ear/Nose normal on gross exam,frail Respiratory system: Bilateral equal air entry, normal vesicular breath sounds, no wheezes or crackles  Cardiovascular system: S1 & S2 heard, RRR. No JVD, murmurs, rubs, gallops or clicks. No pedal edema. Gastrointestinal system: Abdomen is nondistended, soft and nontender. No organomegaly or masses felt. Normal bowel sounds heard. Central nervous system: Alert and oriented. No focal neurological deficits.  Extremities: Left lower extremity externally rotated, tenderness and swelling on the left hip. Left upper extremity sugar tong splint applied over elbow. NVI LLE NVI LUE  Skin: Minor skin tears  Assessment/Plan: 1. Left displaced femoral neck fracture 2. Left intra-articular distal radius fracture  Plan: Transferred down from [facility] for  definitive management of fractures NPO To OR this pm for left hip hemi Have consulted Hand surgeons to see about ORIF of left wrist Further disposition plans to be determined after procedures  Lives alone, independent  Mauri Pole 04/12/2019, 10:24 AM

## 2019-04-12 NOTE — Progress Notes (Signed)
PROGRESS NOTE    Brandilyn Bartosik  [id number] DOB: 03-11-1930 DOA: 04/11/2019 PCP: Betsey Holiday, MD   Brief Narrative:  Patient is 85 year old female with history of hypertension, hyperlipidemia, hypothyroidism who presented with a fall from her home after stepping off of a stool.  She fell on her left side.  She was brought to the emergency department at [facility] where she was found to have left femoral neck fracture and left radius/ulnar fracture.  She was transferred to [facility] for surgery.  Orthopedics following and plan for ORIF today.  Assessment & Plan:   Active Problems:   Essential hypertension, benign   Hypothyroidism   Closed left hip fracture (HCC)   Closed left hip fracture: Orthopedics following.  Plan for ORIF.  Pain well controlled.  PT/OT evaluation after surgery.  SCD for DVT prophylaxis for now.  Continue pain management Patient ambulates with cane at home.  She lives by herself.  Vitamin D deficiency: Started on supplementation  Hypertension: Currently blood pressure stable.  On lisinopril at home.  Hypothyroidism: Continue Synthyroid  Hyperlipidemia: On Lipitor at home.  Anxiety/depression: On clonazepam, sertraline at home.           DVT prophylaxis: SCD Code Status: Full Family Communication: None present at the bedside.Patient said no need to call anybody.She doesnot have family members listed Disposition Plan: Most likely skilled nursing facility    Consultants: Ortho  Procedures:None  Antimicrobials:  Anti-infectives (From admission, onward)   None      Subjective: Patient seen and examined the bedside this morning.  Hemodynamically stable.  Looks comfortable.  Pain well controlled.    Objective: Vitals:   04/11/19 2235 04/12/19 0455  BP: 132/64 135/67  Pulse: (!) 57 68  Resp: 18 16  Temp: 98.1 F (36.7 C) 97.7 F (36.5 C)  TempSrc: Oral Oral  SpO2: 97% 98%  Weight: 51 kg   Height: 5\' 4"  (1.626 m)      Intake/Output Summary (Last 24 hours) at 04/12/2019 1251 Last data filed at 04/12/2019 0645 Gross per 24 hour  Intake 453.43 ml  Output 650 ml  Net -196.57 ml   Filed Weights   04/11/19 2235  Weight: 51 kg    Examination:  General exam: Very pleasant elderly female, deconditioned/debilitated  HEENT:PERRL, Ear/Nose normal on gross exam,frail Respiratory system: Bilateral equal air entry, normal vesicular breath sounds, no wheezes or crackles  Cardiovascular system: S1 & S2 heard, RRR. No JVD, murmurs, rubs, gallops or clicks. No pedal edema. Gastrointestinal system: Abdomen is nondistended, soft and nontender. No organomegaly or masses felt. Normal bowel sounds heard. Central nervous system: Alert and oriented. No focal neurological deficits. Extremities: Left lower extremity externally rotated, tenderness and swelling on the left hip.  Cast applied on left upper extremity. Skin: Minor skin tears   Data Reviewed: I have personally reviewed following labs and imaging studies  CBC: Recent Labs  Lab 04/12/19 0327  WBC 9.1  HGB 12.5  HCT 41.5  MCV 99.5  PLT [id number]*   Basic Metabolic Panel: Recent Labs  Lab 04/12/19 0327  NA 139  K 4.3  CL 104  CO2 28  GLUCOSE 106*  BUN 11  CREATININE 1.06*  CALCIUM 8.6*   GFR: Estimated Creatinine Clearance: 30.7 mL/min (A) (by C-G formula based on SCr of 1.06 mg/dL (H)). Liver Function Tests: Recent Labs  Lab 04/12/19 0327  ALBUMIN 3.7   No results for input(s): LIPASE, AMYLASE in the last 168 hours. No results for input(s):  AMMONIA in the last 168 hours. Coagulation Profile: No results for input(s): INR, PROTIME in the last 168 hours. Cardiac Enzymes: No results for input(s): CKTOTAL, CKMB, CKMBINDEX, TROPONINI in the last 168 hours. BNP (last 3 results) No results for input(s): PROBNP in the last 8760 hours. HbA1C: No results for input(s): HGBA1C in the last 72 hours. CBG: No results for input(s): GLUCAP in the last 168  hours. Lipid Profile: No results for input(s): CHOL, HDL, LDLCALC, TRIG, CHOLHDL, LDLDIRECT in the last 72 hours. Thyroid Function Tests: Recent Labs    04/12/19 0327  TSH 3.470   Anemia Panel: No results for input(s): VITAMINB12, FOLATE, FERRITIN, TIBC, IRON, RETICCTPCT in the last 72 hours. Sepsis Labs: No results for input(s): PROCALCITON, LATICACIDVEN in the last 168 hours.  Recent Results (from the past 240 hour(s))  Surgical PCR screen     Status: None   Collection Time: 04/12/19 12:46 AM   Specimen: Nasal Mucosa; Nasal Swab  Result Value Ref Range Status   MRSA, PCR NEGATIVE NEGATIVE Final   Staphylococcus aureus NEGATIVE NEGATIVE Final    Comment: (NOTE) The Xpert SA Assay (FDA approved for NASAL specimens in patients 22 years of age and older), is one component of a comprehensive surveillance program. It is not intended to diagnose infection nor to guide or monitor treatment. Performed at [facility], [place] [address], [place], [place] [address]          Radiology Studies: Dg Wrist 2 Views Left  Result Date: 04/12/2019 CLINICAL DATA:  Wrist fracture, follow-up EXAM: LEFT WRIST - 2 VIEW COMPARISON:  04/11/2019 FINDINGS: In cast views of the left wrist again demonstrate the comminuted intra-articular displaced distal left radial fracture. Continued mild displacement of fracture fragments and slight posterior angulation noted on the lateral view. IMPRESSION: Distal left radial fracture with continued mild displaced fracture fragments and angulation. Electronically Signed   By: Rolm Baptise M.D.   On: 04/12/2019 12:14   Dg Abd 1 View  Result Date: 04/11/2019 CLINICAL DATA:  Constipation EXAM: ABDOMEN - 1 VIEW COMPARISON:  None. FINDINGS: Lung bases are clear. Dextroscoliosis of the spine. Nonobstructed gas pattern with mild stool. Right hip replacement. IMPRESSION: Nonobstructed gas pattern with mild stool Electronically Signed   By: Donavan Foil  M.D.   On: 04/11/2019 23:50   Dg Pelvis Portable  Result Date: 04/12/2019 CLINICAL DATA:  Fall.  Left hip fracture. EXAM: PORTABLE PELVIS 1-2 VIEWS COMPARISON:  Abdomen 04/11/2019. FINDINGS: Angulated fracture of the left femoral neck is noted. Degenerative change lumbar spine and left hip. Total right hip replacement. Pelvic calcifications consistent phleboliths. IMPRESSION: Angulated fracture of the left femoral neck. Electronically Signed   By: Marcello Moores  Register   On: 04/12/2019 07:55   Chest Portable 1 View  Result Date: 04/11/2019 CLINICAL DATA:  Preop hip fracture EXAM: PORTABLE CHEST 1 VIEW COMPARISON:  11/09/2013 FINDINGS: Stable scarring at the bilateral lung bases. No acute consolidation or pleural effusion. Stable cardiomediastinal silhouette. Aortic atherosclerosis. No pneumothorax. IMPRESSION: No active disease.  Stable scarring at the bilateral lung bases Electronically Signed   By: Donavan Foil M.D.   On: 04/11/2019 23:51        Scheduled Meds: . [START ON 04/13/2019] influenza vaccine adjuvanted  0.5 mL Intramuscular Tomorrow-1000  . [START ON 04/13/2019] pneumococcal 23 valent vaccine  0.5 mL Intramuscular Tomorrow-1000  . Vitamin D (Ergocalciferol)  50,000 Units Oral Q7 days   Continuous Infusions: . methocarbamol (ROBAXIN) IV  LOS: 1 day    Time spent: 25 mins.More than 50% of that time was spent in counseling and/or coordination of care.      Shelly Coss, MD [facility] Pager [phone]  If 7PM-7AM, please contact night-coverage [email or web address] Password [email or web address] 04/12/2019, 12:51 PM

## 2019-04-12 NOTE — Consult Note (Signed)
Reason for Consult: Left hip fracture, left wrist fracture Referring Physician: Tawanna Solo, MD  Kara Mcdowell is an 85 y.o. female.  HPI: Patient is 85 year old female with history of hypertension, hyperlipidemia, hypothyroidism who presented with a fall from her home after stepping off of a stool.  She fell on her left side.  She was brought to the emergency department at [facility] where she was found to have left femoral neck fracture and left radius/ulnar fracture.  She was transferred to [facility] for surgery.   No other complaints.  History of prior right hip hemiarthroplasty.  Past Medical History:  Diagnosis Date  . Anxiety   . COPD (chronic obstructive pulmonary disease) ([place])   . Depression   . Hypertension     Past Surgical History:  Procedure Laterality Date  . HIP ARTHROPLASTY Right 11/06/2013   Procedure: ARTHROPLASTY BIPOLAR HIP;  Surgeon: Meredith Pel, MD;  Location: [facility];  Service: Orthopedics;  Laterality: Right;    Family History  Problem Relation Age of Onset  . Hypertension Neg Hx   . Diabetes Neg Hx     Social History:  reports that she has been smoking cigarettes. She has been smoking about 0.50 packs per day. She has never used smokeless tobacco. No history on file for alcohol and drug.  Allergies: No Known Allergies  Medications:  I have reviewed the patient's current medications. Scheduled: . [START ON 04/13/2019] influenza vaccine adjuvanted  0.5 mL Intramuscular Tomorrow-1000  . [START ON 04/13/2019] pneumococcal 23 valent vaccine  0.5 mL Intramuscular Tomorrow-1000  . Vitamin D (Ergocalciferol)  50,000 Units Oral Q7 days    Results for orders placed or performed during the hospital encounter of 04/11/19 (from the past 24 hour(s))  Surgical PCR screen     Status: None   Collection Time: 04/12/19 12:46 AM   Specimen: Nasal Mucosa; Nasal Swab  Result Value Ref Range   MRSA, PCR NEGATIVE NEGATIVE   Staphylococcus aureus NEGATIVE NEGATIVE   CBC     Status: Abnormal   Collection Time: 04/12/19  3:27 AM  Result Value Ref Range   WBC 9.1 4.0 - 10.5 K/uL   RBC 4.17 3.87 - 5.11 MIL/uL   Hemoglobin 12.5 12.0 - 15.0 g/dL   HCT 41.5 36.0 - 46.0 %   MCV 99.5 80.0 - 100.0 fL   MCH 30.0 26.0 - 34.0 pg   MCHC 30.1 30.0 - 36.0 g/dL   RDW 13.8 11.5 - 15.5 %   Platelets 132 (L) 150 - 400 K/uL   nRBC 0.0 0.0 - 0.2 %  Basic metabolic panel     Status: Abnormal   Collection Time: 04/12/19  3:27 AM  Result Value Ref Range   Sodium 139 135 - 145 mmol/L   Potassium 4.3 3.5 - 5.1 mmol/L   Chloride 104 98 - 111 mmol/L   CO2 28 22 - 32 mmol/L   Glucose, Bld 106 (H) 70 - 99 mg/dL   BUN 11 8 - 23 mg/dL   Creatinine, Ser 1.06 (H) 0.44 - 1.00 mg/dL   Calcium 8.6 (L) 8.9 - 10.3 mg/dL   GFR calc non Af Amer 48 (L) >60 mL/min   GFR calc Af Amer 55 (L) >60 mL/min   Anion gap 7 5 - 15  Type and screen     Status: None   Collection Time: 04/12/19  3:27 AM  Result Value Ref Range   ABO/RH(D) A NEG    Antibody Screen NEG  Sample Expiration      04/15/2019,2359 Performed at [facility], [place] [address], [place], [place] [address]   VITAMIN D 25 Hydroxy (Vit-D Deficiency, Fractures)     Status: Abnormal   Collection Time: 04/12/19  3:27 AM  Result Value Ref Range   Vit D, 25-Hydroxy 12.27 (L) 30 - 100 ng/mL  Albumin     Status: None   Collection Time: 04/12/19  3:27 AM  Result Value Ref Range   Albumin 3.7 3.5 - 5.0 g/dL  TSH     Status: None   Collection Time: 04/12/19  3:27 AM  Result Value Ref Range   TSH 3.470 0.350 - 4.500 uIU/mL  ABO/Rh     Status: None   Collection Time: 04/12/19  4:00 AM  Result Value Ref Range   ABO/RH(D)      A NEG Performed at [facility], [place] [address] [place], [place] [address]      X-ray: CLINICAL DATA:  Fall.  Left hip fracture.  EXAM: PORTABLE PELVIS 1-2 VIEWS  COMPARISON:  Abdomen 04/11/2019.  FINDINGS: Angulated fracture of the left femoral  neck is noted. Degenerative change lumbar spine and left hip. Total right hip replacement. Pelvic calcifications consistent phleboliths.  IMPRESSION: Angulated fracture of the left femoral neck.   Electronically Signed   By: Marcello Moores  Register  CLINICAL DATA:  Wrist fracture, follow-up  EXAM: LEFT WRIST - 2 VIEW  COMPARISON:  04/11/2019  FINDINGS: In cast views of the left wrist again demonstrate the comminuted intra-articular displaced distal left radial fracture. Continued mild displacement of fracture fragments and slight posterior angulation noted on the lateral view.  IMPRESSION: Distal left radial fracture with continued mild displaced fracture fragments and angulation.  ROS  Nothing acute other than noted in HPI  Blood pressure 135/67, pulse 68, temperature 97.7 F (36.5 C), temperature source Oral, resp. rate 16, height 5\' 4"  (1.626 m), weight 51 kg, SpO2 98 %.  Physical Exam: Awake alert a little disoriented with regards to location perhaps related to medication for transport last night  General exam: Very pleasant elderly female, deconditioned/debilitated  HEENT:PERRL, Ear/Nose normal on gross exam,frail Respiratory system: Bilateral equal air entry, normal vesicular breath sounds, no wheezes or crackles  Cardiovascular system: S1 & S2 heard, RRR. No JVD, murmurs, rubs, gallops or clicks. No pedal edema. Gastrointestinal system: Abdomen is nondistended, soft and nontender. No organomegaly or masses felt. Normal bowel sounds heard. Central nervous system: Alert and oriented. No focal neurological deficits.  Extremities: Left lower extremity externally rotated, tenderness and swelling on the left hip. Left upper extremity sugar tong splint applied over elbow. NVI LLE NVI LUE  Skin: Minor skin tears  Assessment/Plan: 1. Left displaced femoral neck fracture 2. Left intra-articular distal radius fracture  Plan: Transferred down from [facility] for  definitive management of fractures NPO To OR this pm for left hip hemi Have consulted Hand surgeons to see about ORIF of left wrist Further disposition plans to be determined after procedures  Lives alone, independent  Mauri Pole 04/12/2019, 10:24 AM

## 2019-04-12 NOTE — Interval H&P Note (Signed)
History and Physical Interval Note:  04/12/2019 3:19 PM  Kara Mcdowell  has presented today for surgery, with the diagnosis of left femoral neck fracture.  The various methods of treatment have been discussed with the patient and family. After consideration of risks, benefits and other options for treatment, the patient has consented to  Procedure(s): ARTHROPLASTY BIPOLAR HIP (HEMIARTHROPLASTY) (Left) as a surgical intervention.  The patient's history has been reviewed, patient examined, no change in status, stable for surgery.  I have reviewed the patient's chart and labs.  Questions were answered to the patient's satisfaction.     Mauri Pole

## 2019-04-12 NOTE — Anesthesia Preprocedure Evaluation (Addendum)
Anesthesia Evaluation  Patient identified by MRN, date of birth, ID band Patient awake    Reviewed: Allergy & Precautions, NPO status , Patient's Chart, lab work & pertinent test results  History of Anesthesia Complications Negative for: history of anesthetic complications  Airway Mallampati: I  TM Distance: >3 FB Neck ROM: Full    Dental   Pulmonary COPD, Current Smoker,    Pulmonary exam normal        Cardiovascular hypertension, Pt. on medications Normal cardiovascular exam     Neuro/Psych PSYCHIATRIC DISORDERS Anxiety Depression negative neurological ROS     GI/Hepatic negative GI ROS, Neg liver ROS,   Endo/Other  Hypothyroidism   Renal/GU negative Renal ROS     Musculoskeletal negative musculoskeletal ROS (+)   Abdominal   Peds  Hematology  Plt 132k    Anesthesia Other Findings   Reproductive/Obstetrics                            Anesthesia Physical  Anesthesia Plan  ASA: III  Anesthesia Plan: General   Post-op Pain Management:    Induction:   PONV Risk Score and Plan: 2 and Treatment may vary due to age or medical condition, Ondansetron and Propofol infusion  Airway Management Planned: LMA  Additional Equipment:   Intra-op Plan:   Post-operative Plan: Extubation in OR  Informed Consent: I have reviewed the patients History and Physical, chart, labs and discussed the procedure including the risks, benefits and alternatives for the proposed anesthesia with the patient or authorized representative who has indicated his/her understanding and acceptance.     Dental advisory given  Plan Discussed with: CRNA and Surgeon  Anesthesia Plan Comments:        Anesthesia Quick Evaluation

## 2019-04-12 NOTE — Anesthesia Preprocedure Evaluation (Addendum)
Anesthesia Evaluation  Patient identified by MRN, date of birth, ID band Patient awake    Reviewed: Allergy & Precautions, NPO status , Patient's Chart, lab work & pertinent test results  History of Anesthesia Complications Negative for: history of anesthetic complications  Airway Mallampati: III  TM Distance: >3 FB Neck ROM: Full    Dental  (+) Edentulous Lower, Edentulous Upper   Pulmonary COPD, Current Smoker and Patient abstained from smoking.,    Pulmonary exam normal        Cardiovascular hypertension, Pt. on medications Normal cardiovascular exam     Neuro/Psych PSYCHIATRIC DISORDERS Anxiety Depression negative neurological ROS     GI/Hepatic negative GI ROS, Neg liver ROS,   Endo/Other  Hypothyroidism   Renal/GU negative Renal ROS     Musculoskeletal negative musculoskeletal ROS (+)   Abdominal   Peds  Hematology  Plt 132k    Anesthesia Other Findings   Reproductive/Obstetrics                            Anesthesia Physical Anesthesia Plan  ASA: II  Anesthesia Plan: General   Post-op Pain Management:    Induction: Intravenous  PONV Risk Score and Plan: 2 and Treatment may vary due to age or medical condition, Ondansetron and Propofol infusion  Airway Management Planned: Oral ETT  Additional Equipment: None  Intra-op Plan:   Post-operative Plan: Extubation in OR  Informed Consent: I have reviewed the patients History and Physical, chart, labs and discussed the procedure including the risks, benefits and alternatives for the proposed anesthesia with the patient or authorized representative who has indicated his/her understanding and acceptance.     Dental advisory given  Plan Discussed with: CRNA and Anesthesiologist  Anesthesia Plan Comments:        Anesthesia Quick Evaluation

## 2019-04-12 NOTE — Transfer of Care (Signed)
Immediate Anesthesia Transfer of Care Note  Patient: Kara Mcdowell  Procedure(s) Performed: ARTHROPLASTY BIPOLAR HIP (HEMIARTHROPLASTY) (Left Hip)  Patient Location: PACU  Anesthesia Type:General  Level of Consciousness: sedated  Airway & Oxygen Therapy: Patient Spontanous Breathing and Patient connected to face mask oxygen  Post-op Assessment: Report given to RN and Post -op Vital signs reviewed and stable  Post vital signs: Reviewed and stable  Last Vitals:  Vitals Value Taken Time  BP 102/43 04/12/19 1702  Temp    Pulse 88 04/12/19 1709  Resp 19 04/12/19 1709  SpO2 97 % 04/12/19 1709  Vitals shown include unvalidated device data.  Last Pain:  Vitals:   04/12/19 1702  TempSrc:   PainSc: Asleep      Patients Stated Pain Goal: 4 ([id number])  Complications: No apparent anesthesia complications

## 2019-04-12 NOTE — Anesthesia Postprocedure Evaluation (Signed)
Anesthesia Post Note  Patient: Kara Mcdowell  Procedure(s) Performed: ARTHROPLASTY BIPOLAR HIP (HEMIARTHROPLASTY) (Left Hip)     Patient location during evaluation: PACU Anesthesia Type: General Level of consciousness: awake and alert Pain management: pain level controlled Vital Signs Assessment: post-procedure vital signs reviewed and stable Respiratory status: spontaneous breathing, nonlabored ventilation, respiratory function stable and patient connected to nasal cannula oxygen Cardiovascular status: blood pressure returned to baseline and stable Postop Assessment: no apparent nausea or vomiting Anesthetic complications: no    Last Vitals:  Vitals:   04/12/19 1800 04/12/19 1813  BP: 104/69 (!) 95/58  Pulse: 81 79  Resp: 14 15  Temp:    SpO2: 95% 90%    Last Pain:  Vitals:   04/12/19 1800  TempSrc:   PainSc: Asleep                 Gerry Blanchfield

## 2019-04-12 NOTE — Plan of Care (Signed)
Plan of care discussed.   

## 2019-04-12 NOTE — Op Note (Signed)
NAME:  Kara Mcdowell                ACCOUNT NO.:  [id number]    MEDICAL RECORD NO.: [id number]   LOCATION:  [id number]                         FACILITY:  [facility]   DATE OF BIRTH:  03-05-1930  PHYSICIAN:  Pietro Cassis. Alvan Dame, M.D.     DATE OF PROCEDURE:  04/12/2019                               OPERATIVE REPORT     PREOPERATIVE DIAGNOSIS:  Left displaced femoral neck fracture.   POSTOPERATIVE DIAGNOSIS:  Left displaced femoral neck fracture.   PROCEDURE:  Left hip hemiarthroplasty utilizing DePuy component, size 4 standard Tri-Lock stem with a 7mm unipolar ball with a +0 adapter.   SURGEON:  Pietro Cassis. Alvan Dame, MD   ASSISTANT:  Griffith Citron, PA-C.   ANESTHESIA:  General.   SPECIMENS:  None.   DRAINS:  None.   BLOOD LOSS:  About 100 cc.   COMPLICATIONS:  None.   INDICATION OF PROCEDURE:  Kara Mcdowell is a 85 year old female who lives independently.  She unfortunately had a fall at her house getting off a stool.  She was admitted to the hospital after radiographs revealed a femoral neck fracture.  She was initially seen at [facility] Emergency Room and requested transfer to [facility] facility.  She was seen and evaluated and was scheduled for surgery for fixation.  The necessity of surgical repair was discussed.  Consent was obtained after reviewing risks of infection, DVT, component failure, and need for revision surgery.  She has a history of a right hip hemiarthroplasty.   PROCEDURE IN DETAIL:  The patient was brought to the operative theater. Once adequate anesthesia, preoperative antibiotics, 2 g of Ancef Administered and 1 gm of Tranexamic Acid the patient was positioned into the right lateral decubitus position with the left side up.  The left lower extremity was then prepped and draped in sterile fashion.  A time-out was performed identifying the patient, planned procedure, and extremity.   A lateral incision was made off the proximal trochanter. Sharp dissection was carried  down to the iliotibial band and gluteal fascia. The gluteal fascia was then incised for posterior approach.  The short external rotators were taken down separate from the posterior capsule. An L capsulotomy was made preserving the posterior leaflet for later anatomic repair. Fracture site was identified and after removing comminuted segments of the posterior femoral neck, the femoral head was removed without difficulty and measured on the back table  using the sizing rings and determined to be 46 mm in diameter.   The proximal femur was then exposed.  Retractors placed.  I then drilled, opened the proximal femur.  Then I hand reamed once and  Irrigated the canal to try to prevent fat emboli.  I began broaching the femur with a starter broach up to a size 4 broach with good medial and lateral metaphyseal fit without evidence of any torsion or movement.  A trial reduction was carried out with a standard offset neck and a +0 adapter with a 15mm ball.  The hip reduced nicely.  The leg lengths appeared to be equal compared to the down leg.   The hip went through a range of motion without evidence of any  subluxation or impingement.   Given these findings, the trial components removed.  The final 4 standard  Tri-Lock stem was opened.  After irrigating the canal, the final stem was impacted and sat at the level where the broach was. Based on this and the trial reduction, a +0 adapter was opened and impacted in the 33mm unipolar ball onto a clean and dry trunnion.  The hip had been irrigated throughout the case and again at this point.  I re- Approximated the posterior capsule to the superior leaflet using a  #1 Vicryl,  and placed a medium Hemovac drain deep.  The remainder of the wound was closed with #1 Vicryl in the iliotibial band and gluteal fascia, a  2-0 Vicryl in the sub-Q tissue and a running 4-0 Monocryl in the skin.  The hip was cleaned, dried, and dressed sterilely using Dermabond  and Aquacel dressing.  Drain site was dressed separately.  She was then brought to recovery room, extubated in stable condition, tolerating the procedure well.  Griffith Citron, PA-C was present and utilized as Environmental consultant for the entire case from  Preoperative positioning to management of the contralateral extremity and retractors to  General facilitation of the procedure.  He was also involved with primary wound closure.         Pietro Cassis Alvan Dame, M.D.

## 2019-04-12 NOTE — Anesthesia Procedure Notes (Signed)
Procedure Name: Intubation Date/Time: 04/12/2019 3:29 PM Performed by: Cynda Familia, CRNA Pre-anesthesia Checklist: Patient identified, Emergency Drugs available, Suction available and Patient being monitored Patient Re-evaluated:Patient Re-evaluated prior to induction Oxygen Delivery Method: Circle System Utilized Preoxygenation: Pre-oxygenation with 100% oxygen Induction Type: IV induction Ventilation: Mask ventilation without difficulty Laryngoscope Size: Miller and 2 Grade View: Grade I Tube type: Oral Number of attempts: 1 Airway Equipment and Method: Stylet Placement Confirmation: ETT inserted through vocal cords under direct vision,  positive ETCO2 and breath sounds checked- equal and bilateral Secured at: 21 cm Tube secured with: Tape Dental Injury: Teeth and Oropharynx as per pre-operative assessment  Comments: Smooth IV induction Oddono-- intubation AM CRNA atraumatic-- teeth and mouth as preop-- bilat BS

## 2019-04-13 ENCOUNTER — Inpatient Hospital Stay (HOSPITAL_COMMUNITY): Payer: Medicare Other | Admitting: Anesthesiology

## 2019-04-13 ENCOUNTER — Encounter (HOSPITAL_COMMUNITY)
Admission: EM | Disposition: A | Discharge status: Skilled Nursing Facility | Payer: Medicare Other | Source: Other Acute Inpatient Hospital | Attending: Internal Medicine

## 2019-04-13 ENCOUNTER — Encounter (HOSPITAL_COMMUNITY): Payer: Self-pay | Admitting: Orthopedic Surgery

## 2019-04-13 HISTORY — PX: ORIF WRIST FRACTURE: SHX2133

## 2019-04-13 LAB — CBC WITH DIFFERENTIAL/PLATELET
Abs Immature Granulocytes: 0.03 10*3/uL (ref 0.00–0.07)
Basophils Absolute: 0 10*3/uL (ref 0.0–0.1)
Basophils Relative: 0 %
Eosinophils Absolute: 0.1 10*3/uL (ref 0.0–0.5)
Eosinophils Relative: 1 %
HCT: 36.7 % (ref 36.0–46.0)
Hemoglobin: 11.1 g/dL — ABNORMAL LOW (ref 12.0–15.0)
Immature Granulocytes: 0 %
Lymphocytes Relative: 13 %
Lymphs Abs: 1.2 10*3/uL (ref 0.7–4.0)
MCH: 30.7 pg (ref 26.0–34.0)
MCHC: 30.2 g/dL (ref 30.0–36.0)
MCV: 101.7 fL — ABNORMAL HIGH (ref 80.0–100.0)
Monocytes Absolute: 0.7 10*3/uL (ref 0.1–1.0)
Monocytes Relative: 7 %
Neutro Abs: 7.3 10*3/uL (ref 1.7–7.7)
Neutrophils Relative %: 79 %
Platelets: 107 10*3/uL — ABNORMAL LOW (ref 150–400)
RBC: 3.61 MIL/uL — ABNORMAL LOW (ref 3.87–5.11)
RDW: 13.9 % (ref 11.5–15.5)
WBC: 9.3 10*3/uL (ref 4.0–10.5)
nRBC: 0 % (ref 0.0–0.2)

## 2019-04-13 LAB — BASIC METABOLIC PANEL
Anion gap: 9 (ref 5–15)
BUN: 11 mg/dL (ref 8–23)
CO2: 26 mmol/L (ref 22–32)
Calcium: 8.3 mg/dL — ABNORMAL LOW (ref 8.9–10.3)
Chloride: 100 mmol/L (ref 98–111)
Creatinine, Ser: 1.05 mg/dL — ABNORMAL HIGH (ref 0.44–1.00)
GFR calc Af Amer: 56 mL/min — ABNORMAL LOW (ref >60)
GFR calc non Af Amer: 48 mL/min — ABNORMAL LOW (ref >60)
Glucose, Bld: 107 mg/dL — ABNORMAL HIGH (ref 70–99)
Potassium: 4.9 mmol/L (ref 3.5–5.1)
Sodium: 135 mmol/L (ref 135–145)

## 2019-04-13 SURGERY — OPEN REDUCTION INTERNAL FIXATION (ORIF) WRIST FRACTURE
Anesthesia: General | Site: Wrist | Laterality: Left

## 2019-04-13 MED ORDER — ONDANSETRON HCL 4 MG/2ML IJ SOLN
INTRAMUSCULAR | Status: DC | PRN
  Administered 2019-04-13: 4 mg via INTRAVENOUS

## 2019-04-13 MED ORDER — CEFAZOLIN SODIUM-DEXTROSE 2-4 GM/100ML-% IV SOLN
2.0000 g | Freq: Once | INTRAVENOUS | Status: AC
  Administered 2019-04-13: 2 g via INTRAVENOUS

## 2019-04-13 MED ORDER — SODIUM CHLORIDE 0.9 % IV SOLN
INTRAVENOUS | Status: DC
  Administered 2019-04-13: 10:00:00 via INTRAVENOUS

## 2019-04-13 MED ORDER — FENTANYL CITRATE (PF) 100 MCG/2ML IJ SOLN
25.0000 ug | INTRAMUSCULAR | Status: DC | PRN
  Administered 2019-04-13: 25 ug via INTRAVENOUS

## 2019-04-13 MED ORDER — MIDAZOLAM HCL 2 MG/2ML IJ SOLN
INTRAMUSCULAR | Status: AC
  Filled 2019-04-13: qty 2

## 2019-04-13 MED ORDER — ACETAMINOPHEN 10 MG/ML IV SOLN
1000.0000 mg | Freq: Once | INTRAVENOUS | Status: DC | PRN
  Administered 2019-04-13: 1000 mg via INTRAVENOUS

## 2019-04-13 MED ORDER — FENTANYL CITRATE (PF) 100 MCG/2ML IJ SOLN
INTRAMUSCULAR | Status: AC
  Filled 2019-04-13: qty 2

## 2019-04-13 MED ORDER — NICOTINE 14 MG/24HR TD PT24
14.0000 mg | MEDICATED_PATCH | Freq: Every day | TRANSDERMAL | Status: DC
  Filled 2019-04-13 (×5): qty 1

## 2019-04-13 MED ORDER — ONDANSETRON HCL 4 MG/2ML IJ SOLN: 4.0000 mg | Freq: Once | INTRAMUSCULAR | Status: DC | PRN

## 2019-04-13 MED ORDER — CEFAZOLIN SODIUM-DEXTROSE 2-4 GM/100ML-% IV SOLN
INTRAVENOUS | Status: AC
  Filled 2019-04-13: qty 100

## 2019-04-13 MED ORDER — 0.9 % SODIUM CHLORIDE (POUR BTL) OPTIME
TOPICAL | Status: DC | PRN
  Administered 2019-04-13: 1000 mL

## 2019-04-13 MED ORDER — FENTANYL CITRATE (PF) 100 MCG/2ML IJ SOLN
INTRAMUSCULAR | Status: DC | PRN
  Administered 2019-04-13: 50 ug via INTRAVENOUS

## 2019-04-13 MED ORDER — FENTANYL CITRATE (PF) 100 MCG/2ML IJ SOLN: 50.0000 ug | INTRAMUSCULAR | Status: DC

## 2019-04-13 MED ORDER — PHENYLEPHRINE HCL (PRESSORS) 10 MG/ML IV SOLN
INTRAVENOUS | Status: DC | PRN
  Administered 2019-04-13: 120 ug via INTRAVENOUS
  Administered 2019-04-13: 80 ug via INTRAVENOUS
  Administered 2019-04-13 (×2): 120 ug via INTRAVENOUS

## 2019-04-13 MED ORDER — DEXAMETHASONE SODIUM PHOSPHATE 10 MG/ML IJ SOLN
INTRAMUSCULAR | Status: DC | PRN
  Administered 2019-04-13: 8 mg via INTRAVENOUS

## 2019-04-13 MED ORDER — SODIUM CHLORIDE 0.9 % IV SOLN
INTRAVENOUS | Status: DC | PRN
  Administered 2019-04-13: 20 ug/min via INTRAVENOUS

## 2019-04-13 MED ORDER — PROPOFOL 10 MG/ML IV BOLUS
INTRAVENOUS | Status: DC | PRN
  Administered 2019-04-13: 20 mg via INTRAVENOUS

## 2019-04-13 MED ORDER — LACTATED RINGERS IV SOLN
INTRAVENOUS | Status: DC
  Administered 2019-04-13: 16:00:00 via INTRAVENOUS

## 2019-04-13 MED ORDER — MIDAZOLAM HCL 2 MG/2ML IJ SOLN: 1.0000 mg | INTRAMUSCULAR | Status: DC

## 2019-04-13 MED ORDER — LIDOCAINE 2% (20 MG/ML) 5 ML SYRINGE
INTRAMUSCULAR | Status: DC | PRN
  Administered 2019-04-13: 100 mg via INTRAVENOUS

## 2019-04-13 MED ORDER — BUPIVACAINE HCL 0.25 % IJ SOLN
INTRAMUSCULAR | Status: AC
  Filled 2019-04-13: qty 1

## 2019-04-13 MED ORDER — BUPIVACAINE HCL (PF) 0.5 % IJ SOLN
INTRAMUSCULAR | Status: AC
  Filled 2019-04-13: qty 30

## 2019-04-13 MED ORDER — ACETAMINOPHEN 10 MG/ML IV SOLN
INTRAVENOUS | Status: AC
  Filled 2019-04-13: qty 100

## 2019-04-13 SURGICAL SUPPLY — 67 items
BAG ZIPLOCK 12X15 (MISCELLANEOUS) ×3 IMPLANT
BIT DRILL 2.2 SS TIBIAL (BIT) ×2 IMPLANT
BNDG CONFORM 2 STRL LF (GAUZE/BANDAGES/DRESSINGS) ×4 IMPLANT
BNDG ELASTIC 3X5.8 VLCR STR LF (GAUZE/BANDAGES/DRESSINGS) ×2 IMPLANT
BNDG GAUZE ELAST 4 BULKY (GAUZE/BANDAGES/DRESSINGS) ×4 IMPLANT
COVER SURGICAL LIGHT HANDLE (MISCELLANEOUS) ×3 IMPLANT
COVER WAND RF STERILE (DRAPES) IMPLANT
CUFF TOURN SGL QUICK 18X4 (TOURNIQUET CUFF) ×3 IMPLANT
DECANTER SPIKE VIAL GLASS SM (MISCELLANEOUS) ×3 IMPLANT
DRAPE OEC MINIVIEW 54X84 (DRAPES) ×3 IMPLANT
DRAPE U-SHAPE 47X51 STRL (DRAPES) ×3 IMPLANT
DRSG ADAPTIC 3X8 NADH LF (GAUZE/BANDAGES/DRESSINGS) ×3 IMPLANT
DRSG EMULSION OIL 3X3 NADH (GAUZE/BANDAGES/DRESSINGS) ×2 IMPLANT
DRSG PAD ABDOMINAL 8X10 ST (GAUZE/BANDAGES/DRESSINGS) ×3 IMPLANT
ELECT REM PT RETURN 15FT ADLT (MISCELLANEOUS) ×3 IMPLANT
EVACUATOR 1/8 PVC DRAIN (DRAIN) ×3 IMPLANT
GAUZE 4X4 16PLY RFD (DISPOSABLE) ×6 IMPLANT
GAUZE SPONGE 4X4 12PLY STRL (GAUZE/BANDAGES/DRESSINGS) ×4 IMPLANT
GLOVE BIOGEL PI IND STRL 8.5 (GLOVE) ×1 IMPLANT
GLOVE BIOGEL PI INDICATOR 8.5 (GLOVE) ×2
GLOVE ECLIPSE 8.0 STRL XLNG CF (GLOVE) ×3 IMPLANT
GLOVE SURG ORTHO 8.0 STRL STRW (GLOVE) ×3 IMPLANT
GOWN STRL REUS W/TWL XL LVL3 (GOWN DISPOSABLE) ×3 IMPLANT
K-WIRE 1.6 (WIRE) ×2
K-WIRE DBL TROCAR .062X4 ×6 IMPLANT
K-WIRE FX5X1.6XNS BN SS (WIRE) ×1
KIT BASIN OR (CUSTOM PROCEDURE TRAY) ×3 IMPLANT
KIT TURNOVER KIT A (KITS) IMPLANT
KWIRE DBL TROCAR .062X4 IMPLANT
KWIRE FX5X1.6XNS BN SS (WIRE) IMPLANT
MANIFOLD NEPTUNE II (INSTRUMENTS) ×3 IMPLANT
NS IRRIG 1000ML POUR BTL (IV SOLUTION) ×3 IMPLANT
PACK ORTHO EXTREMITY (CUSTOM PROCEDURE TRAY) ×3 IMPLANT
PAD CAST 3X4 CTTN HI CHSV (CAST SUPPLIES) ×1 IMPLANT
PAD CAST 4YDX4 CTTN HI CHSV (CAST SUPPLIES) ×1 IMPLANT
PADDING CAST COTTON 3X4 STRL (CAST SUPPLIES) ×2
PADDING CAST COTTON 4X4 STRL (CAST SUPPLIES) ×2
PEG LOCKING SMOOTH 2.2X16 (Screw) ×2 IMPLANT
PEG LOCKING SMOOTH 2.2X20 (Screw) ×6 IMPLANT
PEG LOCKING SMOOTH 2.2X22 (Screw) ×4 IMPLANT
PLATE DVR CROSSLOCK STD RT (Plate) ×2 IMPLANT
SCREW LOCK 14X2.7X 3 LD TPR (Screw) IMPLANT
SCREW LOCK 16X2.7X 3 LD TPR (Screw) IMPLANT
SCREW LOCKING 2.7X14 (Screw) ×8 IMPLANT
SCREW LOCKING 2.7X15MM (Screw) ×2 IMPLANT
SCREW LOCKING 2.7X16 (Screw) ×2 IMPLANT
SPLINT FIBERGLASS 4X30 (CAST SUPPLIES) ×3 IMPLANT
SUCTION FRAZIER HANDLE 10FR (MISCELLANEOUS) ×2
SUCTION TUBE FRAZIER 10FR DISP (MISCELLANEOUS) IMPLANT
SUT BONE WAX W31G (SUTURE) ×3 IMPLANT
SUT ETHILON 6 0 PS 3 18 (SUTURE) ×3 IMPLANT
SUT MERSILENE 4 0 P 3 (SUTURE) ×3 IMPLANT
SUT MNCRL AB 4-0 PS2 18 (SUTURE) ×3 IMPLANT
SUT PROLENE 3 0 PS 2 (SUTURE) ×3 IMPLANT
SUT PROLENE 4 0 P 3 18 (SUTURE) ×3 IMPLANT
SUT PROLENE 4 0 PS 2 18 (SUTURE) ×2 IMPLANT
SUT PROLENE 4 0 RB 1 (SUTURE) ×2
SUT PROLENE 4-0 RB1 .5 CRCL 36 (SUTURE) ×1 IMPLANT
SUT VIC AB 0 CT1 27 (SUTURE) ×4
SUT VIC AB 0 CT1 27XBRD ANTBC (SUTURE) ×2 IMPLANT
SUT VIC AB 2-0 CT1 27 (SUTURE) ×2
SUT VIC AB 2-0 CT1 TAPERPNT 27 (SUTURE) ×1 IMPLANT
SUT VIC AB 2-0 PS2 27 (SUTURE) ×2 IMPLANT
SUT VIC AB 4-0 PS2 27 (SUTURE) ×4 IMPLANT
TOWEL OR 17X26 10 PK STRL BLUE (TOWEL DISPOSABLE) ×3 IMPLANT
UNDERPAD 30X36 HEAVY ABSORB (UNDERPADS AND DIAPERS) ×3 IMPLANT
WATER STERILE IRR 1000ML POUR (IV SOLUTION) ×3 IMPLANT

## 2019-04-13 NOTE — Progress Notes (Addendum)
PROGRESS NOTE    Cullen Gilchrist  [id number] DOB: 1930-05-30 DOA: 04/11/2019 PCP: Betsey Holiday, MD   Brief Narrative:  Patient is 85 year old female with history of hypertension, hyperlipidemia, hypothyroidism who presented with a fall from her home after stepping off of a stool.  She fell on her left side.  She was brought to the emergency department at [facility] where she was found to have left femoral neck fracture and left radiusfracture.  She was transferred to [facility] for surgery.  Orthopedics following and she underwent  ORIF.  Assessment & Plan:   Active Problems:   Essential hypertension, benign   Hypothyroidism   Closed left hip fracture (HCC)   Closed left hip fracture: Orthopedics following.  Underwent left hip hemiarthoplasty .  Pain well controlled this mrng.  PT/OT evaluation pending.  SCD for DVT prophylaxis for now.  Continue pain management Patient ambulates with cane at home.  She lives by herself. Most likely she will need skilled nursing facility on discharge.  Social worker consulted.  Left radius fracture:Xray Distal left radial fracture with continued mild displaced fracture fragments and angulation.Ortho planning for intervention.On cast  Vitamin D deficiency: Started on supplementation  Hypertension: BP soft today so started on gentle IV fluids.  Home antihypertensives on hold  Hypothyroidism: Continue Synthyroid  Hyperlipidemia: On Lipitor at home.  Anxiety/depression: On clonazepam, sertraline at home.           DVT prophylaxis: SCD Code Status: Full Family Communication: No contacts listed on chart. Called friend,but number doesnot work Disposition Plan: Most likely skilled nursing facility    Consultants: Ortho  Procedures: Left hip hemiarthroplasty  Antimicrobials:  Anti-infectives (From admission, onward)   Start     Dose/Rate Route Frequency Ordered Stop   04/13/19 0600  ceFAZolin (ANCEF) IVPB 2g/100 mL premix     2  g 200 mL/hr over 30 Minutes Intravenous On call to O.R. 04/12/19 1353 04/12/19 1531   04/12/19 2130  ceFAZolin (ANCEF) IVPB 2g/100 mL premix     2 g 200 mL/hr over 30 Minutes Intravenous Every 6 hours 04/12/19 1850 04/13/19 0423      Subjective: Patient seen and examined at bedside this morning.  Looks very comfortable.  Very pleasant.  Pain well controlled.  No questions  Objective: Vitals:   04/12/19 2119 04/13/19 0151 04/13/19 0442 04/13/19 1113  BP: (!) 95/46 (!) 98/47 (!) 116/53 (!) 92/43  Pulse: 75 73 77 68  Resp: 16 16 16 16   Temp: 98.3 F (36.8 C) 98.2 F (36.8 C) 98.5 F (36.9 C) 98.5 F (36.9 C)  TempSrc:  Oral Oral Oral  SpO2: 93% 99% 99% 100%  Weight:      Height:        Intake/Output Summary (Last 24 hours) at 04/13/2019 1239 Last data filed at 04/13/2019 0442 Gross per 24 hour  Intake 1450 ml  Output 925 ml  Net 525 ml   Filed Weights   04/11/19 2235 04/12/19 1353  Weight: 51 kg 51 kg    Examination:  General exam: Very pleasant elderly female, deconditioned/debilitated  HEENT:PERRL, Ear/Nose normal on gross exam,frail Respiratory system: Bilateral equal air entry, normal vesicular breath sounds, no wheezes or crackles  Cardiovascular system: S1 & S2 heard, RRR. No JVD, murmurs, rubs, gallops or clicks. No pedal edema. Gastrointestinal system: Abdomen is nondistended, soft and nontender. No organomegaly or masses felt. Normal bowel sounds heard. Central nervous system: Alert and oriented. No focal neurological deficits. Extremities: Clean surgical wound  on the left hip, cast applied on left upper extremity. Skin: Minor skin tears   Data Reviewed: I have personally reviewed following labs and imaging studies  CBC: Recent Labs  Lab 04/12/19 0327 04/13/19 0305  WBC 9.1 9.3  NEUTROABS  --  7.3  HGB 12.5 11.1*  HCT 41.5 36.7  MCV 99.5 101.7*  PLT 132* [id number]*   Basic Metabolic Panel: Recent Labs  Lab 04/12/19 0327 04/13/19 0305  NA 139 135   K 4.3 4.9  CL 104 100  CO2 28 26  GLUCOSE 106* 107*  BUN 11 11  CREATININE 1.06* 1.05*  CALCIUM 8.6* 8.3*   GFR: Estimated Creatinine Clearance: 31 mL/min (A) (by C-G formula based on SCr of 1.05 mg/dL (H)). Liver Function Tests: Recent Labs  Lab 04/12/19 0327  ALBUMIN 3.7   No results for input(s): LIPASE, AMYLASE in the last 168 hours. No results for input(s): AMMONIA in the last 168 hours. Coagulation Profile: Recent Labs  Lab 04/12/19 1330  INR 0.9   Cardiac Enzymes: No results for input(s): CKTOTAL, CKMB, CKMBINDEX, TROPONINI in the last 168 hours. BNP (last 3 results) No results for input(s): PROBNP in the last 8760 hours. HbA1C: No results for input(s): HGBA1C in the last 72 hours. CBG: No results for input(s): GLUCAP in the last 168 hours. Lipid Profile: No results for input(s): CHOL, HDL, LDLCALC, TRIG, CHOLHDL, LDLDIRECT in the last 72 hours. Thyroid Function Tests: Recent Labs    04/12/19 0327  TSH 3.470   Anemia Panel: No results for input(s): VITAMINB12, FOLATE, FERRITIN, TIBC, IRON, RETICCTPCT in the last 72 hours. Sepsis Labs: No results for input(s): PROCALCITON, LATICACIDVEN in the last 168 hours.  Recent Results (from the past 240 hour(s))  Surgical PCR screen     Status: None   Collection Time: 04/12/19 12:46 AM   Specimen: Nasal Mucosa; Nasal Swab  Result Value Ref Range Status   MRSA, PCR NEGATIVE NEGATIVE Final   Staphylococcus aureus NEGATIVE NEGATIVE Final    Comment: (NOTE) The Xpert SA Assay (FDA approved for NASAL specimens in patients 83 years of age and older), is one component of a comprehensive surveillance program. It is not intended to diagnose infection nor to guide or monitor treatment. Performed at [facility], [place] [address], [place], [place] [address]   SARS CORONAVIRUS 2 (TAT 6-24 HRS) Nasopharyngeal Nasopharyngeal Swab     Status: None   Collection Time: 04/12/19 12:48 AM   Specimen:  Nasopharyngeal Swab  Result Value Ref Range Status   SARS Coronavirus 2 NEGATIVE NEGATIVE Final    Comment: (NOTE) SARS-CoV-2 target nucleic acids are NOT DETECTED. The SARS-CoV-2 RNA is generally detectable in upper and lower respiratory specimens during the acute phase of infection. Negative results do not preclude SARS-CoV-2 infection, do not rule out co-infections with other pathogens, and should not be used as the sole basis for treatment or other patient management decisions. Negative results must be combined with clinical observations, patient history, and epidemiological information. The expected result is Negative. Fact Sheet for Patients: SugarRoll.be Fact Sheet for Healthcare Providers: https://www.woods-mathews.com/ This test is not yet approved or cleared by the [place] FDA and  has been authorized for detection and/or diagnosis of SARS-CoV-2 by FDA under an Emergency Use Authorization (EUA). This EUA will remain  in effect (meaning this test can be used) for the duration of the COVID-19 declaration under Section 56 4(b)(1) of the Act, 21 U.S.C. section 360bbb-3(b)(1), unless the authorization is terminated or  revoked sooner. Performed at [facility], [place] [address] [place], [place] [address]          Radiology Studies: Dg Wrist 2 Views Left  Result Date: 04/12/2019 CLINICAL DATA:  Wrist fracture, follow-up EXAM: LEFT WRIST - 2 VIEW COMPARISON:  04/11/2019 FINDINGS: In cast views of the left wrist again demonstrate the comminuted intra-articular displaced distal left radial fracture. Continued mild displacement of fracture fragments and slight posterior angulation noted on the lateral view. IMPRESSION: Distal left radial fracture with continued mild displaced fracture fragments and angulation. Electronically Signed   By: Rolm Baptise M.D.   On: 04/12/2019 12:14   Dg Abd 1 View  Result Date: 04/11/2019  CLINICAL DATA:  Constipation EXAM: ABDOMEN - 1 VIEW COMPARISON:  None. FINDINGS: Lung bases are clear. Dextroscoliosis of the spine. Nonobstructed gas pattern with mild stool. Right hip replacement. IMPRESSION: Nonobstructed gas pattern with mild stool Electronically Signed   By: Donavan Foil M.D.   On: 04/11/2019 23:50   Pelvis Portable  Result Date: 04/12/2019 CLINICAL DATA:  Left hip arthroplasty EXAM: PORTABLE PELVIS 1-2 VIEWS COMPARISON:  Radiograph 04/12/2019 FINDINGS: Patient is post left hip hemiarthroplasty. Expected alignment of the articulating femoral component. Right hip hemiarthroplasty is noted as well. Mild bilateral hip osteoarthrosis is present with sclerotic changes of the acetabuli. Postsurgical soft tissue changes are noted over the left hip with soft tissue gas, swelling and left hip intra-articular gas as well. Remaining soft tissues are unremarkable. IMPRESSION: 1. Expected postoperative changes of recent left hip hemiarthroplasty without evidence of hardware complication. 2. Mild bilateral hip osteoarthrosis, similar to prior study. Electronically Signed   By: Lovena Le M.D.   On: 04/12/2019 17:31   Dg Pelvis Portable  Result Date: 04/12/2019 CLINICAL DATA:  Fall.  Left hip fracture. EXAM: PORTABLE PELVIS 1-2 VIEWS COMPARISON:  Abdomen 04/11/2019. FINDINGS: Angulated fracture of the left femoral neck is noted. Degenerative change lumbar spine and left hip. Total right hip replacement. Pelvic calcifications consistent phleboliths. IMPRESSION: Angulated fracture of the left femoral neck. Electronically Signed   By: Marcello Moores  Register   On: 04/12/2019 07:55   Chest Portable 1 View  Result Date: 04/11/2019 CLINICAL DATA:  Preop hip fracture EXAM: PORTABLE CHEST 1 VIEW COMPARISON:  11/09/2013 FINDINGS: Stable scarring at the bilateral lung bases. No acute consolidation or pleural effusion. Stable cardiomediastinal silhouette. Aortic atherosclerosis. No pneumothorax. IMPRESSION:  No active disease.  Stable scarring at the bilateral lung bases Electronically Signed   By: Donavan Foil M.D.   On: 04/11/2019 23:51        Scheduled Meds: . docusate sodium  100 mg Oral BID  . ferrous sulfate  325 mg Oral TID PC  . influenza vaccine adjuvanted  0.5 mL Intramuscular Tomorrow-1000  . nicotine  14 mg Transdermal Daily  . pneumococcal 23 valent vaccine  0.5 mL Intramuscular Tomorrow-1000  . Vitamin D (Ergocalciferol)  50,000 Units Oral Q7 days   Continuous Infusions: . sodium chloride 75 mL/hr at 04/13/19 1007  . methocarbamol (ROBAXIN) IV       LOS: 2 days    Time spent: 25 mins.More than 50% of that time was spent in counseling and/or coordination of care.      Shelly Coss, MD [facility] Pager [phone]  If 7PM-7AM, please contact night-coverage [email or web address] Password [facility] 04/13/2019, 12:39 PM

## 2019-04-13 NOTE — Transfer of Care (Signed)
Immediate Anesthesia Transfer of Care Note  Patient: Earney Mallet  Procedure(s) Performed: OPEN REDUCTION INTERNAL FIXATION (ORIF) WRIST FRACTURE (Left Wrist)  Patient Location: PACU  Anesthesia Type:General  Level of Consciousness: awake, patient cooperative and responds to stimulation  Airway & Oxygen Therapy: Patient Spontanous Breathing and Patient connected to face mask oxygen  Post-op Assessment: Report given to RN and Post -op Vital signs reviewed and stable  Post vital signs: Reviewed and stable  Last Vitals:  Vitals Value Taken Time  BP 128/52 04/13/19 1812  Temp    Pulse 79 04/13/19 1813  Resp 16 04/13/19 1813  SpO2 98 % 04/13/19 1813  Vitals shown include unvalidated device data.  Last Pain:  Vitals:   04/13/19 1627  TempSrc: Oral  PainSc: 0-No pain      Patients Stated Pain Goal: 3 ([id number])  Complications: No apparent anesthesia complications

## 2019-04-13 NOTE — Progress Notes (Signed)
PT SEEN/EXAMINED PT WITH HIGHLY COMMINUTED LEFT DISTAL RADIUS FRACTURE PT HAD LEFT HEMIARTHROPLASTY YESTERDAY PLAN FOR ORIF LEFT DISTAL RADIUS TONIGHT PT VOICED UNDERSTANDING OF PLAN TO SURGERY NOW FOR ORIF LEFT DISTAL RADIUS  R/B/A DISCUSSED WITH PT IN HOLDING AREA.  PT VOICED UNDERSTANDING OF PLAN CONSENT SIGNED DAY OF SURGERY PT SEEN AND EXAMINED PRIOR TO OPERATIVE PROCEDURE/DAY OF SURGERY SITE MARKED. QUESTIONS ANSWERED WILL REMAIN AN INPATIENT FOLLOWING SURGERY

## 2019-04-13 NOTE — Progress Notes (Signed)
     Subjective: 1 Day Post-Op Procedure(s) (LRB): ARTHROPLASTY BIPOLAR HIP (HEMIARTHROPLASTY) (Left)   Patient reports pain as mild, in the left hip.  Patient feels that her hip is significantly better with regards to pain and it was prior to surgery.  She is resting comfortably in bed awaiting surgery for left wrist fracture.  No events reported throughout the night.     Objective:   VITALS:   Vitals:   04/13/19 0151 04/13/19 0442  BP: (!) 98/47 (!) 116/53  Pulse: 73 77  Resp: 16 16  Temp: 98.2 F (36.8 C) 98.5 F (36.9 C)  SpO2: 99% 99%    Dorsiflexion/Plantar flexion intact Incision: dressing C/D/I No cellulitis present Compartment soft  LABS Recent Labs    04/12/19 0327 04/13/19 0305  HGB 12.5 11.1*  HCT 41.5 36.7  WBC 9.1 9.3  PLT 132* 107*    Recent Labs    04/12/19 0327 04/13/19 0305  NA 139 135  K 4.3 4.9  BUN 11 11  CREATININE 1.06* 1.05*  GLUCOSE 106* 107*     Assessment/Plan: 1 Day Post-Op Procedure(s) (LRB): ARTHROPLASTY BIPOLAR HIP (HEMIARTHROPLASTY) (Left) NPO with anticipation of proceeding with left wrist surgery Up with therapy, when able to tolerate, Discharge disposition to be determined   [place] Pugh. Kara Mcdowell   PAC  04/13/2019, 8:40 AM

## 2019-04-13 NOTE — Anesthesia Postprocedure Evaluation (Signed)
Anesthesia Post Note  Patient: Milliana Marinas  Procedure(s) Performed: OPEN REDUCTION INTERNAL FIXATION (ORIF) WRIST FRACTURE (Left Wrist)     Patient location during evaluation: PACU Anesthesia Type: General Level of consciousness: awake and alert Pain management: pain level controlled Vital Signs Assessment: post-procedure vital signs reviewed and stable Respiratory status: spontaneous breathing, nonlabored ventilation and respiratory function stable Cardiovascular status: blood pressure returned to baseline and stable Postop Assessment: no apparent nausea or vomiting Anesthetic complications: no    Last Vitals:  Vitals:   04/13/19 1845 04/13/19 1855  BP: (!) 108/44 (!) 112/46  Pulse: 74 74  Resp: 16 16  Temp: 37.5 C 37.1 C  SpO2: 96% 100%    Last Pain:  Vitals:   04/13/19 1855  TempSrc: Oral  PainSc:                  Catalina Gravel

## 2019-04-13 NOTE — Op Note (Signed)
PREOPERATIVE DIAGNOSIS: Left wrist intra-articular distal radius fracture 3 more fragments  POSTOPERATIVE DIAGNOSIS: Same  ATTENDING SURGEON: Dr. Iran Planas who is scrubbed and present for the entire procedure  ASSISTANT SURGEON: None  ANESTHESIA: General via laryngeal mask airway  OPERATIVE PROCEDURE: #1: Open treatment of left wrist intra-articular distal radius fracture 3 more fragments requiring internal fixation #2: Left wrist brachial radialis tendon tenotomy and release #3: Radiographs 3 views left wrist  IMPLANTS: None  RADIOGRAPHIC INTERPRETATION: AP lateral and oblique views of the wrist do show the volar plate fixation place in good position  SURGICAL INDICATIONS: Patient is a right-hand-dominant female who sustained the left wrist and hip fracture.  Patient had undergone hemiarthroplasty the night before was scheduled undergo open treatment of the left wrist fracture today.  Risk benefits and alternatives were discussed in detail with the patient and signed informed consent was obtained  SURGICAL TECHNIQUE: Patient was palpated find the preoperative holding area marked for marker made on left wrist indicate correct operative site.  Patient brought back operating placed supine on the anesthesia table where the general anesthetic was administered.  Preoperative antibiotics were given prior any skin incision.  A well-padded tourniquet then placed on the left brachium and stay with the appropriate drape.  Left upper extremities then prepped and draped normal sterile fashion.  A timeout was called the correct site was identified the procedure then begun.  Attention then turned to the left wrist.  The limb was then elevated tourniquet insufflated.  A longitudinal incision made directly over the FCR sheath.  Going through the floor the FCR sheath the FPL was then swept out away.  The pronator quadratus was exposed and L-shaped pronator quadratus flap was then elevated and the  intra-articular fracture was then exposed.  This was an intra-articular fracture 3 more fragments.  In order to visualize and reduce the radial column the brachioradialis was then carefully released and tenotomized along the radial styloid with protection of the first dorsal compartment.  The fracture hematoma was then evacuated.  Open reduction was then performed.  The volar plate was then applied and held distally with a K wire.  Plate position was then confirmed.  Using the mini C arm plate position was then confirmed.  The oblong screw hole was then placed proximally.  Following this distal fixation was carried out from an ulnar to radial direction with the distal locking pegs and screw within the styloid.  Final shaft fixation was done with combination of locking and locking cortical screws, bicortical screws.  The wound was thoroughly irrigated.  Final radiographs were then obtained.  The pronator quadratus was then closed with 2-0 Vicryl.  The subcutaneous tissues closed with 4-0 Vicryl and the skin closed with simple 4-0 Prolene.  Adaptic dressing a sterile compressive bandage then applied.  The tourniquet of been deflated prior to closure of the skin with good hemostasis.  Volar splint was then applied.  The patient was extubated taken recovery room in good condition.  POSTOPERATIVE PLAN: Patient be admitted back to the medicine service.  Rehab nonweightbearing in the left wrist okay to weight-bear through a platform walker in the left upper extremity.  Patient may need seen back in the office in 2 weeks for wound check suture removal.  She needs to keep the splint on the entire time do not remove it.  If there is any questions please contact me.  Radiographs out of the splint and suture removal of the first postoperative visit.

## 2019-04-13 NOTE — Evaluation (Signed)
Physical Therapy Evaluation Patient Details Name: Kara Mcdowell MRN: [id number] DOB: [birth year] Today's Date: 04/13/2019   History of Present Illness  85 yo female admitted 10/20 post-fall at home, sustaining L femoral neck displaced fracture and L radial fracture. S/p L hip hemiarthroplasty posterior approach on 10/20, awaiting L radius surgery. PMH includes R hemiarthroplasty 2015 due to fall at home, COPD, depression, HTN.  Clinical Impression   Pt presents with decreased knowledge of posterior hip precautions, decreased knowledge of LUE NWB status, generalized weakness, history of multiple falls, difficulty performing mobility tasks, and decreased activity tolerance. Pt to benefit from acute PT to address deficits. Pt tolerated stand pivot to recliner with min assist for steadying, requiring max verbal and tactile cuing for pt to maintain posterior hip precautions and LUE NWB precaution. PT recommending SNF level of care post-acutely, pt agrees. PT to progress mobility as tolerated, and will continue to follow acutely.      Follow Up Recommendations SNF;Supervision/Assistance - 24 hour    Equipment Recommendations  None recommended by PT    Recommendations for Other Services       Precautions / Restrictions Precautions Precautions: Fall;Posterior Hip Precaution Booklet Issued: Yes (comment) Precaution Comments: PT reinforced no hip flexion >90*, no hip IR past neutral, no hip adduction past midline, no crossing legs Restrictions Weight Bearing Restrictions: Yes LUE Weight Bearing: Non weight bearing LLE Weight Bearing: Weight bearing as tolerated      Mobility  Bed Mobility Overal bed mobility: Needs Assistance Bed Mobility: Supine to Sit     Supine to sit: Mod assist;HOB elevated     General bed mobility comments: mod assist for trunk elevation, LE lifting and transition to EOB. PT reinforcing posterior precautions, especially trunk flexion <90* and keeping hip  abducted/ER. Pt with tendency towards IR.  Transfers Overall transfer level: Needs assistance Equipment used: Rolling walker (2 wheeled) Transfers: Sit to/from Stand Sit to Stand: Min assist;From elevated surface         General transfer comment: Min assist for steadying pt and RW, pt using RW for steadying self with RUE and LUE NWB during transfer. Pt required min assist for stand pivot for steadying, reinforcing neutral to ER of hip during turning to maintain precautions. Requires verbal and tactile cuing to maintain upright chest position when lowering to sitting.  Ambulation/Gait Ambulation/Gait assistance: (NT)              Stairs            Wheelchair Mobility    Modified Rankin (Stroke Patients Only)       Balance Overall balance assessment: History of Falls;Needs assistance Sitting-balance support: No upper extremity supported;Feet supported Sitting balance-Leahy Scale: Fair Sitting balance - Comments: anterior leaning with fatigue, with attempt to use LUE to steady self (VC for NWB)   Standing balance support: Single extremity supported;During functional activity Standing balance-Leahy Scale: Poor Standing balance comment: reliant on PT and AD support                             Pertinent Vitals/Pain Pain Assessment: 0-10 Pain Score: 3  Pain Location: L hip Pain Descriptors / Indicators: Sore Pain Intervention(s): Limited activity within patient's tolerance;Monitored during session;Premedicated before session;Repositioned    Home Living Family/patient expects to be discharged to:: Skilled nursing facility Living Arrangements: Alone               Additional Comments: Pt living alone PTA, states "  I don't want anyone staying with me or helping me".    Prior Function Level of Independence: Independent with assistive device(s)         Comments: pt reports using cane for ambulation PTA, history of falls at home     Hand  Dominance   Dominant Hand: Right    Extremity/Trunk Assessment   Upper Extremity Assessment Upper Extremity Assessment: Generalized weakness    Lower Extremity Assessment Lower Extremity Assessment: Generalized weakness;LLE deficits/detail LLE Deficits / Details: suspected post-surgical weakness; able to perform ankle pumps, quad set LLE Sensation: WNL    Cervical / Trunk Assessment Cervical / Trunk Assessment: Normal  Communication   Communication: No difficulties  Cognition Arousal/Alertness: Awake/alert Behavior During Therapy: WFL for tasks assessed/performed Overall Cognitive Status: Impaired/Different from baseline Area of Impairment: Attention;Memory;Following commands;Safety/judgement;Problem solving                   Current Attention Level: Sustained Memory: Decreased recall of precautions;Decreased short-term memory Following Commands: Follows one step commands inconsistently Safety/Judgement: Decreased awareness of safety;Decreased awareness of deficits   Problem Solving: Difficulty sequencing;Requires tactile cues General Comments: Pt unable to give verbal teachback of posterior hip precautions, requires frequent verbal cuing for safety with mobility      General Comments      Exercises Total Joint Exercises Ankle Circles/Pumps: AROM;Both;10 reps;Seated(for circulation) Quad Sets: AROM;Left;10 reps;Seated   Assessment/Plan    PT Assessment Patient needs continued PT services  PT Problem List Decreased strength;Decreased mobility;Decreased range of motion;Decreased coordination;Decreased knowledge of precautions;Decreased activity tolerance;Decreased balance;Decreased knowledge of use of DME;Pain;Decreased cognition       PT Treatment Interventions DME instruction;Therapeutic activities;Gait training;Therapeutic exercise;Patient/family education;Balance training;Stair training;Functional mobility training    PT Goals (Current goals can be found in  the Care Plan section)  Acute Rehab PT Goals Patient Stated Goal: walk, be independent PT Goal Formulation: With patient Time For Goal Achievement: 04/27/19 Potential to Achieve Goals: Good    Frequency Min 3X/week   Barriers to discharge Decreased caregiver support      Co-evaluation               AM-PAC PT "6 Clicks" Mobility  Outcome Measure Help needed turning from your back to your side while in a flat bed without using bedrails?: A Lot Help needed moving from lying on your back to sitting on the side of a flat bed without using bedrails?: A Lot Help needed moving to and from a bed to a chair (including a wheelchair)?: A Lot Help needed standing up from a chair using your arms (e.g., wheelchair or bedside chair)?: A Little Help needed to walk in hospital room?: A Lot Help needed climbing 3-5 steps with a railing? : Total 6 Click Score: 12    End of Session Equipment Utilized During Treatment: Gait belt Activity Tolerance: Patient tolerated treatment well;Patient limited by pain;Patient limited by fatigue Patient left: in chair;with chair alarm set;with call bell/phone within reach Nurse Communication: Mobility status PT Visit Diagnosis: Other abnormalities of gait and mobility (R26.89);Difficulty in walking, not elsewhere classified (R26.2)    Time: [id number] PT Time Calculation (min) (ACUTE ONLY): 27 min   Charges:   PT Evaluation $PT Eval Low Complexity: 1 Low PT Treatments $Therapeutic Activity: 8-22 mins       Mikelle Myrick Conception Chancy, PT Acute Rehabilitation Services Pager [phone]  Office [phone]  Nyaira Hodgens D Ranbir Chew 04/13/2019, 1:41 PM

## 2019-04-13 NOTE — Anesthesia Procedure Notes (Signed)
Procedure Name: LMA Insertion Date/Time: 04/13/2019 5:00 PM Performed by: Silas [place], CRNA Pre-anesthesia Checklist: Patient identified, Emergency Drugs available, Suction available and Patient being monitored Patient Re-evaluated:Patient Re-evaluated prior to induction Oxygen Delivery Method: Circle system utilized Preoxygenation: Pre-oxygenation with 100% oxygen Induction Type: IV induction Ventilation: Mask ventilation without difficulty LMA: LMA inserted LMA Size: 3.0 Tube type: Oral Number of attempts: 1 Placement Confirmation: ETT inserted through vocal cords under direct vision,  positive ETCO2 and breath sounds checked- equal and bilateral Tube secured with: Tape Dental Injury: Teeth and Oropharynx as per pre-operative assessment

## 2019-04-14 ENCOUNTER — Encounter (HOSPITAL_COMMUNITY): Payer: Self-pay | Admitting: Orthopedic Surgery

## 2019-04-14 LAB — BASIC METABOLIC PANEL
Anion gap: 9 (ref 5–15)
BUN: 9 mg/dL (ref 8–23)
CO2: 22 mmol/L (ref 22–32)
Calcium: 8.1 mg/dL — ABNORMAL LOW (ref 8.9–10.3)
Chloride: 104 mmol/L (ref 98–111)
Creatinine, Ser: 0.89 mg/dL (ref 0.44–1.00)
GFR calc Af Amer: >60 mL/min (ref >60)
GFR calc non Af Amer: 59 mL/min — ABNORMAL LOW (ref >60)
Glucose, Bld: 93 mg/dL (ref 70–99)
Potassium: 3.9 mmol/L (ref 3.5–5.1)
Sodium: 135 mmol/L (ref 135–145)

## 2019-04-14 LAB — CBC
HCT: 33.9 % — ABNORMAL LOW (ref 36.0–46.0)
Hemoglobin: 10.3 g/dL — ABNORMAL LOW (ref 12.0–15.0)
MCH: 30.3 pg (ref 26.0–34.0)
MCHC: 30.4 g/dL (ref 30.0–36.0)
MCV: 99.7 fL (ref 80.0–100.0)
Platelets: 89 10*3/uL — ABNORMAL LOW (ref 150–400)
RBC: 3.4 MIL/uL — ABNORMAL LOW (ref 3.87–5.11)
RDW: 13.8 % (ref 11.5–15.5)
WBC: 7.3 10*3/uL (ref 4.0–10.5)
nRBC: 0 % (ref 0.0–0.2)

## 2019-04-14 MED ORDER — POLYETHYLENE GLYCOL 3350 17 G PO PACK
17.0000 g | PACK | Freq: Every day | ORAL | Status: DC
  Administered 2019-04-14 – 2019-04-19 (×4): 17 g via ORAL
  Filled 2019-04-14 (×5): qty 1

## 2019-04-14 MED ORDER — ASPIRIN 81 MG PO CHEW: 81.0000 mg | CHEWABLE_TABLET | Freq: Two times a day (BID) | ORAL | 0 refills | Status: AC

## 2019-04-14 MED ORDER — FERROUS SULFATE 325 (65 FE) MG PO TABS: 325.0000 mg | ORAL_TABLET | Freq: Three times a day (TID) | ORAL | 0 refills | Status: AC

## 2019-04-14 MED ORDER — LEVOTHYROXINE SODIUM 75 MCG PO TABS
75.0000 ug | ORAL_TABLET | Freq: Every day | ORAL | Status: DC
  Administered 2019-04-14 – 2019-04-19 (×6): 75 ug via ORAL
  Filled 2019-04-14 (×6): qty 1

## 2019-04-14 MED ORDER — ASPIRIN 81 MG PO CHEW
81.0000 mg | CHEWABLE_TABLET | Freq: Every day | ORAL | Status: DC
  Administered 2019-04-14 – 2019-04-19 (×6): 81 mg via ORAL
  Filled 2019-04-14 (×6): qty 1

## 2019-04-14 MED ORDER — METHOCARBAMOL 500 MG PO TABS: 500.0000 mg | ORAL_TABLET | Freq: Four times a day (QID) | ORAL | 0 refills | Status: AC | PRN

## 2019-04-14 MED ORDER — SERTRALINE HCL 50 MG PO TABS
50.0000 mg | ORAL_TABLET | Freq: Two times a day (BID) | ORAL | Status: DC
  Administered 2019-04-14 – 2019-04-19 (×11): 50 mg via ORAL
  Filled 2019-04-14 (×11): qty 1

## 2019-04-14 MED ORDER — ATORVASTATIN CALCIUM 40 MG PO TABS
80.0000 mg | ORAL_TABLET | Freq: Every day | ORAL | Status: DC
  Administered 2019-04-14 – 2019-04-18 (×5): 80 mg via ORAL
  Filled 2019-04-14 (×5): qty 2

## 2019-04-14 MED ORDER — PNEUMOCOCCAL VAC POLYVALENT 25 MCG/0.5ML IJ INJ
0.5000 mL | INJECTION | INTRAMUSCULAR | Status: DC
  Filled 2019-04-14: qty 0.5

## 2019-04-14 MED ORDER — POLYETHYLENE GLYCOL 3350 17 G PO PACK: 17.0000 g | PACK | Freq: Two times a day (BID) | ORAL | 0 refills | Status: AC

## 2019-04-14 MED ORDER — DOCUSATE SODIUM 100 MG PO CAPS: 100.0000 mg | ORAL_CAPSULE | Freq: Two times a day (BID) | ORAL | 0 refills | Status: AC

## 2019-04-14 MED ORDER — HYDROCODONE-ACETAMINOPHEN 5-325 MG PO TABS: 1.0000 | ORAL_TABLET | Freq: Four times a day (QID) | ORAL | 0 refills | Status: DC | PRN

## 2019-04-14 MED ORDER — PHENYLEPHRINE HCL-NACL 10-0.9 MG/250ML-% IV SOLN
INTRAVENOUS | Status: AC
  Filled 2019-04-14: qty 250

## 2019-04-14 MED ORDER — CEFAZOLIN SODIUM-DEXTROSE 1-4 GM/50ML-% IV SOLN
1.0000 g | Freq: Three times a day (TID) | INTRAVENOUS | Status: AC
  Administered 2019-04-14 (×3): 1 g via INTRAVENOUS
  Filled 2019-04-14 (×3): qty 50

## 2019-04-14 MED ORDER — INFLUENZA VAC A&B SA ADJ QUAD 0.5 ML IM PRSY
0.5000 mL | PREFILLED_SYRINGE | INTRAMUSCULAR | Status: DC
  Filled 2019-04-14: qty 0.5

## 2019-04-14 MED ORDER — CLONAZEPAM 1 MG PO TABS
1.0000 mg | ORAL_TABLET | Freq: Three times a day (TID) | ORAL | Status: DC
  Administered 2019-04-14 – 2019-04-19 (×17): 1 mg via ORAL
  Filled 2019-04-14 (×17): qty 1

## 2019-04-14 NOTE — NC FL2 (Addendum)
[place] LEVEL OF CARE SCREENING TOOL     IDENTIFICATION  Patient Name: Kara Mcdowell Birthdate: 06-Jun-1930 Sex: female Admission Date (Current Location): 04/11/2019  [facility] and [place] Number:  Herbalist and Address:         Provider Number: [id number]  Attending Physician Name and Address:  Shelly Coss, MD  Relative Name and Phone Number:       Current Level of Care: Hospital Recommended Level of Care: [place] Prior Approval Number:    Date Approved/Denied:   PASRR Number:  [id number]  Discharge Plan: SNF    Current Diagnoses: Patient Active Problem List   Diagnosis Date Noted  . Closed left hip fracture ([place]) 04/11/2019  . Hip fracture ([place]) 11/06/2013  . Intertrochanteric fracture of right hip ([place]) 11/05/2013  . Essential hypertension, benign 11/05/2013  . Anxiety state, unspecified 11/05/2013  . Depression 11/05/2013  . COPD exacerbation ([place]) 11/05/2013  . Hip fracture, right ([place]) 11/05/2013  . Hypothyroidism 11/05/2013    Orientation RESPIRATION BLADDER Height & Weight     Self, Time, Situation, Place  Normal Continent Weight: 51 kg Height:  5\' 4"  (162.6 cm)  BEHAVIORAL SYMPTOMS/MOOD NEUROLOGICAL BOWEL NUTRITION STATUS      Continent Diet(regular)  AMBULATORY STATUS COMMUNICATION OF NEEDS Skin   Extensive Assist Verbally Normal                       Personal Care Assistance Level of Assistance  Bathing, Feeding, Dressing Bathing Assistance: Limited assistance Feeding assistance: Limited assistance Dressing Assistance: Limited assistance     Functional Limitations Info  Sight Sight Info: Adequate        SPECIAL CARE FACTORS FREQUENCY  PT (By licensed PT)     PT Frequency: 5x weekly              Contractures Contractures Info: Not present    Additional Factors Info  Code Status Code Status Info: full             Current Medications (04/14/2019):  This is the current  hospital active medication list Current Facility-Administered Medications  Medication Dose Route Frequency Provider Last Rate Last Dose  . aspirin chewable tablet 81 mg  81 mg Oral Daily Shelly Coss, MD   81 mg at 04/14/19 0951  . ceFAZolin (ANCEF) IVPB 1 g/50 mL premix  1 g Intravenous Q8H Iran Planas, MD   Stopped at 04/14/19 [phone]  . docusate sodium (COLACE) capsule 100 mg  100 mg Oral BID Danae Orleans, PA-C   100 mg at 04/14/19 [id number]  . ferrous sulfate tablet 325 mg  325 mg Oral TID PC Danae Orleans, PA-C   325 mg at 04/14/19 [id number]  . HYDROcodone-acetaminophen (NORCO/VICODIN) 5-325 MG per tablet 1-2 tablet  1-2 tablet Oral Q6H PRN Danae Orleans, PA-C   2 tablet at 04/14/19 [id number]  . influenza vaccine adjuvanted (FLUAD) injection 0.5 mL  0.5 mL Intramuscular Tomorrow-1000 Shelly Coss, MD   Stopped at 04/13/19 0954  . levothyroxine (SYNTHROID) tablet 75 mcg  75 mcg Oral Q0600 Shelly Coss, MD   75 mcg at 04/14/19 0755  . menthol-cetylpyridinium (CEPACOL) lozenge 3 mg  1 lozenge Oral PRN Danae Orleans, PA-C       Or  . phenol (CHLORASEPTIC) mouth spray 1 spray  1 spray Mouth/Throat PRN Babish, Matthew, PA-C      . methocarbamol (ROBAXIN) tablet 500 mg  500 mg Oral Q6H PRN Danae Orleans, PA-C  500 mg at 04/13/19 1012   Or  . methocarbamol (ROBAXIN) 500 mg in dextrose 5 % 50 mL IVPB  500 mg Intravenous Q6H PRN Babish, Matthew, PA-C      . metoCLOPramide (REGLAN) tablet 5-10 mg  5-10 mg Oral Q8H PRN Danae Orleans, PA-C       Or  . metoCLOPramide (REGLAN) injection 5-10 mg  5-10 mg Intravenous Q8H PRN Danae Orleans, PA-C      . morphine 2 MG/ML injection 0.5 mg  0.5 mg Intravenous Q2H PRN Babish, Matthew, PA-C      . nicotine (NICODERM CQ - dosed in mg/24 hours) patch 14 mg  14 mg Transdermal Daily Danae Orleans, PA-C   Stopped at 04/13/19 1007  . ondansetron (ZOFRAN) tablet 4 mg  4 mg Oral Q6H PRN Danae Orleans, PA-C       Or  . ondansetron [facility]) injection 4 mg  4  mg Intravenous Q6H PRN Babish, Matthew, PA-C      . phenylephrine (NEOSYNEPHRINE) 10-0.9 MG/250ML-% infusion           . pneumococcal 23 valent vaccine (PNU-IMMUNE) injection 0.5 mL  0.5 mL Intramuscular Tomorrow-1000 Shelly Coss, MD      . Vitamin D (Ergocalciferol) (DRISDOL) capsule 50,000 Units  50,000 Units Oral Q7 days Shelly Coss, MD   50,000 Units at 04/12/19 2019     Discharge Medications: Please see discharge summary for a list of discharge medications.  Relevant Imaging Results:  Relevant Lab Results:   Additional Information ssn: [id number]  Leeroy Cha, RN

## 2019-04-14 NOTE — Care Management Important Message (Signed)
Important Message  Patient Details IM Letter given to Velva Harman RN to present to the Patient Name: Kara Mcdowell MRN: [id number] Date of Birth: 12/05/1929   Medicare Important Message Given:  Yes     Kerin Salen 04/14/2019, 3:39 PM

## 2019-04-14 NOTE — Progress Notes (Signed)
Patient ID: Virginialee Waver, female   DOB: 1929-11-16, 85 y.o.   MRN: [id number]  Subjective: POD #2 s/p left him hemiarthroplasty, POD #1 s/p ORIF left wrist No events   Patient reports pain as mild with regards to left hip, no major complaints of left wrist pain  Objective:   VITALS:   Vitals:   04/14/19 0205 04/14/19 0557  BP: (!) 113/50 128/62  Pulse: 69 84  Resp: 16 18  Temp: 98.4 F (36.9 C) 98.4 F (36.9 C)  SpO2: 98% 100%    Neurovascular intact Incision: dressing C/D/I, LLE and left upper extremity splint  LABS Recent Labs    04/12/19 0327 04/13/19 0305 04/14/19 0307  HGB 12.5 11.1* 10.3*  HCT 41.5 36.7 33.9*  WBC 9.1 9.3 7.3  PLT 132* 107* 89*    Recent Labs    04/12/19 0327 04/13/19 0305 04/14/19 0307  NA 139 135 135  K 4.3 4.9 3.9  BUN 11 11 9   CREATININE 1.06* 1.05* 0.89  GLUCOSE 106* 107* 93    Recent Labs    04/12/19 1330  INR 0.9     Assessment/Plan: 1. POD #2 s/p left hip hemiarthroplasty for left hip fracture - stable and doing well 2. POD #1 s/p ORIF distal radius fracture - stable   Plan: WBAT LLE Weight bearing through elbow with platform walker on LUE  Disposition likely to SNF Cleared for discharge Orthopaedically RTC in 2 weeks likely a Wednesday to see [facility] and [facility] same day Aspirin for DVT prophylaxis for 4 weeks

## 2019-04-14 NOTE — TOC Progression Note (Addendum)
Transition of Care [facility]) - Progression Note    Patient Details  Name: Kara Mcdowell MRN: [id number] Date of Birth: 20-Jun-1930  Transition of [facility]) CM/SW Contact  Leeroy Cha, RN Phone Number: 04/14/2019, 10:39 AM  Clinical Narrative:    Search for snf placement started.  Fl2 sent out to area snf's.  [facility] pending. [facility] has avialble beds./AG will have Tuesday. Text message from tammy blankley.       Expected Discharge Plan and Services                                                 Social Determinants of Health (SDOH) Interventions    Readmission Risk Interventions No flowsheet data found.

## 2019-04-14 NOTE — Evaluation (Signed)
Occupational Therapy Evaluation Patient Details Name: Kara Mcdowell MRN: [id number] DOB: Apr 23, 1930 Today's Date: 04/14/2019    History of Present Illness 85 yo female admitted 10/20 post-fall at home, sustaining L femoral neck displaced fracture and L radial fracture. S/p L hip hemiarthroplasty posterior approach on 10/20, awaiting L radius surgery. PMH includes R hemiarthroplasty 2015 due to fall at home, COPD, depression, HTN.   Clinical Impression   Pt was seen for initial evaluation.  She did not retain THPs from yesterday nor reviewing 3xs in session.  Encouraged L finger movement for edema and positioned on pillow. Asked nursing to follow through with this and posted sign in room.  Will defer further OT intervention to SNF.    Follow Up Recommendations  SNF    Equipment Recommendations  3 in 1 bedside commode    Recommendations for Other Services       Precautions / Restrictions Precautions Precautions: Fall;Posterior Hip Precaution Booklet Issued: Yes (comment) Precaution Comments: reinforced posterior THPS Restrictions Weight Bearing Restrictions: Yes LUE Weight Bearing: Non weight bearing LLE Weight Bearing: Weight bearing as tolerated      Mobility Bed Mobility         Supine to sit: Mod assist     General bed mobility comments: assist for LLE and trunk  Transfers   Equipment used: Rolling walker (2 wheeled)   Sit to Stand: Min assist         General transfer comment: assist to stand and steady.  Min/mod A for SPT to chair with R hand on RW.  Platform is being added to RW by our dept    Balance                                           ADL either performed or assessed with clinical judgement   ADL Overall ADL's : Needs assistance/impaired                         Toilet Transfer: Minimal assistance;Moderate assistance;Stand-pivot(chair, R arm only on RW)             General ADL Comments: Pt did not want to  complete ADL this am.  Based on clinical judgment, pt needs min A for UB adls and grooming, set up for feeding,  and max to total A for LB adls following THPS     Vision         Perception     Praxis      Pertinent Vitals/Pain Pain Assessment: Faces Faces Pain Scale: Hurts little more Pain Location: L hip Pain Descriptors / Indicators: Sore Pain Intervention(s): Limited activity within patient's tolerance;Monitored during session;Premedicated before session;Repositioned     Hand Dominance Right   Extremity/Trunk Assessment Upper Extremity Assessment Upper Extremity Assessment: Generalized weakness           Communication Communication Communication: No difficulties   Cognition Arousal/Alertness: Awake/alert Behavior During Therapy: WFL for tasks assessed/performed                         Memory: Decreased recall of precautions;Decreased short-term memory Following Commands: Follows one step commands consistently Safety/Judgement: Decreased awareness of safety;Decreased awareness of deficits     General Comments: did not recall any of posterior THPS after reviewing 3xs   General Comments  Exercises     Shoulder Instructions      Home Living Family/patient expects to be discharged to:: Skilled nursing facility Living Arrangements: Alone                               Additional Comments: pt states she will be going for rehab      Prior Functioning/Environment Level of Independence: Independent with assistive device(s)                 OT Problem List:        OT Treatment/Interventions:      OT Goals(Current goals can be found in the care plan section) Acute Rehab OT Goals Patient Stated Goal: walk, be independent OT Goal Formulation: All assessment and education complete, DC therapy  OT Frequency:     Barriers to D/C:            Co-evaluation              AM-PAC OT "6 Clicks" Daily Activity     Outcome  Measure Help from another person eating meals?: A Little Help from another person taking care of personal grooming?: A Little Help from another person toileting, which includes using toliet, bedpan, or urinal?: A Lot Help from another person bathing (including washing, rinsing, drying)?: A Lot Help from another person to put on and taking off regular upper body clothing?: A Little Help from another person to put on and taking off regular lower body clothing?: Total 6 Click Score: 14   End of Session    Activity Tolerance: Patient tolerated treatment well Patient left: in chair;with call bell/phone within reach;with chair alarm set  OT Visit Diagnosis: Pain Pain - Right/Left: Left Pain - part of body: Arm;Hip                Time: [id number] OT Time Calculation (min): 33 min Charges:  OT General Charges $OT Visit: 1 Visit OT Evaluation $OT Eval Low Complexity: 1 Low OT Treatments $Therapeutic Activity: 8-22 mins  Lesle Chris, OTR/L Acute Rehabilitation Services [phone] [facility] pager [phone] office 04/14/2019  Brendin Situ 04/14/2019, 9:27 AM

## 2019-04-14 NOTE — Progress Notes (Signed)
PROGRESS NOTE    Kara Mcdowell  [id number] DOB: 03/22/1930 DOA: 04/11/2019 PCP: Betsey Holiday, MD   Brief Narrative:  Patient is 85 year old female with history of hypertension, hyperlipidemia, hypothyroidism who presented with a fall from her home after stepping off of a stool.  She fell on her left side.  She was brought to the emergency department at [facility] where she was found to have left femoral neck fracture and left radiusfracture.  She was transferred to [facility] for surgery.  Orthopedics following and she underwent  ORIF for left hip fracture and left radius fracture.  She is medically stable for discharge to skilled nursing facility as soon as the bed is available.  Assessment & Plan:   Active Problems:   Essential hypertension, benign   Hypothyroidism   Closed left hip fracture (HCC)   Closed left hip fracture: Orthopedics following.  Underwent left hip hemiarthoplasty .  Pain well controlled this mrng.  Continue pain management Patient ambulates with cane at home.  She lives by herself. PT/OT recommended skilled nursing facility.  Started on aspirin for DVT prophylaxis.  She needs to follow-up with orthopedics in 2 weeks after discharge.  Left radius fracture:Xray Distal left radial fracture with continued mild displaced fracture fragments and angulation.S/P ORIF  Vitamin D deficiency: Started on supplementation  Hypertension: BP currently stable  Hypothyroidism: Continue Synthyroid  Hyperlipidemia: On Lipitor at home.  Anxiety/depression: On clonazepam, sertraline at home.           DVT prophylaxis: SCD,aspirin Code Status: Full Family Communication: No contacts listed on chart. Called friend,but number doesnot work Disposition Plan: skilled nursing facility as soon as the bed is available   Consultants: Ortho  Procedures: Left hip hemiarthroplasty  Antimicrobials:  Anti-infectives (From admission, onward)   Start     Dose/Rate Route  Frequency Ordered Stop   04/14/19 0030  ceFAZolin (ANCEF) IVPB 1 g/50 mL premix     1 g 100 mL/hr over 30 Minutes Intravenous Every 8 hours 04/14/19 0025 04/14/19 2159   04/13/19 1700  ceFAZolin (ANCEF) IVPB 2g/100 mL premix     2 g 200 mL/hr over 30 Minutes Intravenous  Once 04/13/19 1641 04/13/19 1657   04/13/19 1643  ceFAZolin (ANCEF) 2-4 GM/100ML-% IVPB    Note to Pharmacy: Georgena Spurling   : cabinet override      04/13/19 1643 04/13/19 1647   04/13/19 0600  ceFAZolin (ANCEF) IVPB 2g/100 mL premix     2 g 200 mL/hr over 30 Minutes Intravenous On call to O.R. 04/12/19 1353 04/12/19 1531   04/12/19 2130  ceFAZolin (ANCEF) IVPB 2g/100 mL premix     2 g 200 mL/hr over 30 Minutes Intravenous Every 6 hours 04/12/19 1850 04/13/19 0413      Subjective: Patient seen and examined the bedside this morning.  Hemodynamically stable.  Comfortable.  No new issues/events.  Objective: Vitals:   04/13/19 2052 04/13/19 2255 04/14/19 0205 04/14/19 0557  BP: (!) 108/47 (!) 97/53 (!) 113/50 128/62  Pulse: 66 66 69 84  Resp: 17 16 16 18   Temp: 99 F (37.2 C) 98.8 F (37.1 C) 98.4 F (36.9 C) 98.4 F (36.9 C)  TempSrc: Oral Oral Oral   SpO2: 98% 99% 98% 100%  Weight:      Height:        Intake/Output Summary (Last 24 hours) at 04/14/2019 1144 Last data filed at 04/14/2019 0844 Gross per 24 hour  Intake 2402.2 ml  Output 1160 ml  Net 1242.2 ml   Filed Weights   04/11/19 2235 04/12/19 1353 04/13/19 1613  Weight: 51 kg 51 kg 51 kg    Examination:  General exam: Very pleasant elderly female, deconditioned/debilitated  HEENT:PERRL, Ear/Nose normal on gross exam,frail Respiratory system: Bilateral equal air entry, normal vesicular breath sounds, no wheezes or crackles  Cardiovascular system: S1 & S2 heard, RRR. No JVD, murmurs, rubs, gallops or clicks. No pedal edema. Gastrointestinal system: Abdomen is nondistended, soft and nontender. No organomegaly or masses felt. Normal bowel sounds  heard. Central nervous system: Alert and oriented. No focal neurological deficits. Extremities: Clean surgical wound on the left hip, dressing applied on left upper extremity. Skin: Minor skin tears   Data Reviewed: I have personally reviewed following labs and imaging studies  CBC: Recent Labs  Lab 04/12/19 0327 04/13/19 0305 04/14/19 0307  WBC 9.1 9.3 7.3  NEUTROABS  --  7.3  --   HGB 12.5 11.1* 10.3*  HCT 41.5 36.7 33.9*  MCV 99.5 101.7* 99.7  PLT 132* 107* 89*   Basic Metabolic Panel: Recent Labs  Lab 04/12/19 0327 04/13/19 0305 04/14/19 0307  NA 139 135 135  K 4.3 4.9 3.9  CL 104 100 104  CO2 28 26 22   GLUCOSE 106* 107* 93  BUN 11 11 9   CREATININE 1.06* 1.05* 0.89  CALCIUM 8.6* 8.3* 8.1*   GFR: Estimated Creatinine Clearance: 36.5 mL/min (by C-G formula based on SCr of 0.89 mg/dL). Liver Function Tests: Recent Labs  Lab 04/12/19 0327  ALBUMIN 3.7   No results for input(s): LIPASE, AMYLASE in the last 168 hours. No results for input(s): AMMONIA in the last 168 hours. Coagulation Profile: Recent Labs  Lab 04/12/19 1330  INR 0.9   Cardiac Enzymes: No results for input(s): CKTOTAL, CKMB, CKMBINDEX, TROPONINI in the last 168 hours. BNP (last 3 results) No results for input(s): PROBNP in the last 8760 hours. HbA1C: No results for input(s): HGBA1C in the last 72 hours. CBG: No results for input(s): GLUCAP in the last 168 hours. Lipid Profile: No results for input(s): CHOL, HDL, LDLCALC, TRIG, CHOLHDL, LDLDIRECT in the last 72 hours. Thyroid Function Tests: Recent Labs    04/12/19 0327  TSH 3.470   Anemia Panel: No results for input(s): VITAMINB12, FOLATE, FERRITIN, TIBC, IRON, RETICCTPCT in the last 72 hours. Sepsis Labs: No results for input(s): PROCALCITON, LATICACIDVEN in the last 168 hours.  Recent Results (from the past 240 hour(s))  Surgical PCR screen     Status: None   Collection Time: 04/12/19 12:46 AM   Specimen: Nasal Mucosa; Nasal  Swab  Result Value Ref Range Status   MRSA, PCR NEGATIVE NEGATIVE Final   Staphylococcus aureus NEGATIVE NEGATIVE Final    Comment: (NOTE) The Xpert SA Assay (FDA approved for NASAL specimens in patients 64 years of age and older), is one component of a comprehensive surveillance program. It is not intended to diagnose infection nor to guide or monitor treatment. Performed at [facility], [place] [address] [place], [place] [address]   SARS CORONAVIRUS 2 (TAT 6-24 HRS) Nasopharyngeal Nasopharyngeal Swab     Status: None   Collection Time: 04/12/19 12:48 AM   Specimen: Nasopharyngeal Swab  Result Value Ref Range Status   SARS Coronavirus 2 NEGATIVE NEGATIVE Final    Comment: (NOTE) SARS-CoV-2 target nucleic acids are NOT DETECTED. The SARS-CoV-2 RNA is generally detectable in upper and lower respiratory specimens during the acute phase of infection. Negative results do not preclude SARS-CoV-2  infection, do not rule out co-infections with other pathogens, and should not be used as the sole basis for treatment or other patient management decisions. Negative results must be combined with clinical observations, patient history, and epidemiological information. The expected result is Negative. Fact Sheet for Patients: SugarRoll.be Fact Sheet for Healthcare Providers: https://www.woods-mathews.com/ This test is not yet approved or cleared by the [place] FDA and  has been authorized for detection and/or diagnosis of SARS-CoV-2 by FDA under an Emergency Use Authorization (EUA). This EUA will remain  in effect (meaning this test can be used) for the duration of the COVID-19 declaration under Section 56 4(b)(1) of the Act, 21 U.S.C. section 360bbb-3(b)(1), unless the authorization is terminated or revoked sooner. Performed at [facility], [place] [address], [place], [place] [address]          Radiology Studies:  Dg Wrist 2 Views Left  Result Date: 04/12/2019 CLINICAL DATA:  Wrist fracture, follow-up EXAM: LEFT WRIST - 2 VIEW COMPARISON:  04/11/2019 FINDINGS: In cast views of the left wrist again demonstrate the comminuted intra-articular displaced distal left radial fracture. Continued mild displacement of fracture fragments and slight posterior angulation noted on the lateral view. IMPRESSION: Distal left radial fracture with continued mild displaced fracture fragments and angulation. Electronically Signed   By: Rolm Baptise M.D.   On: 04/12/2019 12:14   Pelvis Portable  Result Date: 04/12/2019 CLINICAL DATA:  Left hip arthroplasty EXAM: PORTABLE PELVIS 1-2 VIEWS COMPARISON:  Radiograph 04/12/2019 FINDINGS: Patient is post left hip hemiarthroplasty. Expected alignment of the articulating femoral component. Right hip hemiarthroplasty is noted as well. Mild bilateral hip osteoarthrosis is present with sclerotic changes of the acetabuli. Postsurgical soft tissue changes are noted over the left hip with soft tissue gas, swelling and left hip intra-articular gas as well. Remaining soft tissues are unremarkable. IMPRESSION: 1. Expected postoperative changes of recent left hip hemiarthroplasty without evidence of hardware complication. 2. Mild bilateral hip osteoarthrosis, similar to prior study. Electronically Signed   By: Lovena Le M.D.   On: 04/12/2019 17:31        Scheduled Meds: . aspirin  81 mg Oral Daily  . atorvastatin  80 mg Oral QHS  . clonazePAM  1 mg Oral TID  . docusate sodium  100 mg Oral BID  . ferrous sulfate  325 mg Oral TID PC  . influenza vaccine adjuvanted  0.5 mL Intramuscular Tomorrow-1000  . levothyroxine  75 mcg Oral Q0600  . nicotine  14 mg Transdermal Daily  . pneumococcal 23 valent vaccine  0.5 mL Intramuscular Tomorrow-1000  . pneumococcal 23 valent vaccine  0.5 mL Intramuscular Tomorrow-1000  . polyethylene glycol  17 g Oral Daily  . sertraline  50 mg Oral BID  .  Vitamin D (Ergocalciferol)  50,000 Units Oral Q7 days   Continuous Infusions: .  ceFAZolin (ANCEF) IV Stopped (04/14/19 0707)  . methocarbamol (ROBAXIN) IV       LOS: 3 days    Time spent: 25 mins.More than 50% of that time was spent in counseling and/or coordination of care.      Shelly Coss, MD [facility] Pager [phone]  If 7PM-7AM, please contact night-coverage [email or web address] Password [facility] 04/14/2019, 11:44 AM

## 2019-04-15 DIAGNOSIS — S72002A Fracture of unspecified part of neck of left femur, initial encounter for closed fracture: Secondary | ICD-10-CM | POA: Diagnosis not present

## 2019-04-15 LAB — CBC
HCT: 31.3 % — ABNORMAL LOW (ref 36.0–46.0)
Hemoglobin: 9.7 g/dL — ABNORMAL LOW (ref 12.0–15.0)
MCH: 30.5 pg (ref 26.0–34.0)
MCHC: 31 g/dL (ref 30.0–36.0)
MCV: 98.4 fL (ref 80.0–100.0)
Platelets: 108 10*3/uL — ABNORMAL LOW (ref 150–400)
RBC: 3.18 MIL/uL — ABNORMAL LOW (ref 3.87–5.11)
RDW: 13.8 % (ref 11.5–15.5)
WBC: 6.6 10*3/uL (ref 4.0–10.5)
nRBC: 0 % (ref 0.0–0.2)

## 2019-04-15 LAB — BASIC METABOLIC PANEL
Anion gap: 8 (ref 5–15)
BUN: 16 mg/dL (ref 8–23)
CO2: 25 mmol/L (ref 22–32)
Calcium: 8.3 mg/dL — ABNORMAL LOW (ref 8.9–10.3)
Chloride: 102 mmol/L (ref 98–111)
Creatinine, Ser: 1.03 mg/dL — ABNORMAL HIGH (ref 0.44–1.00)
GFR calc Af Amer: 57 mL/min — ABNORMAL LOW (ref >60)
GFR calc non Af Amer: 49 mL/min — ABNORMAL LOW (ref >60)
Glucose, Bld: 108 mg/dL — ABNORMAL HIGH (ref 70–99)
Potassium: 4.2 mmol/L (ref 3.5–5.1)
Sodium: 135 mmol/L (ref 135–145)

## 2019-04-15 NOTE — Progress Notes (Signed)
PROGRESS NOTE    Kara Mcdowell  [id number] DOB: 1929-07-30 DOA: 04/11/2019 PCP: Betsey Holiday, MD   Brief Narrative:  Patient is 85 year old female with history of hypertension, hyperlipidemia, hypothyroidism who presented with a fall from her home after stepping off of a stool.  She fell on her left side.  She was brought to the emergency department at [facility] where she was found to have left femoral neck fracture and left radiusfracture.  She was transferred to [facility] for surgery.  Orthopedics following and she underwent  ORIF for left hip fracture and left radius fracture.  She is medically stable for discharge to skilled nursing facility as soon as the bed is available.  Assessment & Plan:   Active Problems:   Essential hypertension, benign   Hypothyroidism   Closed left hip fracture (HCC)   Closed left hip fracture: Orthopedics following.  Underwent left hip hemiarthoplasty .  Pain well controlled this mrng.  Continue pain management Patient ambulates with cane at home.  She lives by herself. PT/OT recommended skilled nursing facility.  Started on aspirin for DVT prophylaxis.  She needs to follow-up with orthopedics in 2 weeks after discharge.  Left radius fracture:Xray Distal left radial fracture with continued mild displaced fracture fragments and angulation.S/P ORIF  Vitamin D deficiency: Started on supplementation  Hypertension: BP currently stable  Hypothyroidism: Continue Synthyroid  Hyperlipidemia: On Lipitor at home.  Anxiety/depression: On clonazepam, sertraline at home.           DVT prophylaxis: SCD,aspirin Code Status: Full Family Communication: Tried to call niece and nephew by calling the number provided by the patient, none of them received. Disposition Plan: skilled nursing facility as soon as the bed is available.PASSAR pending   Consultants: Ortho  Procedures: Left hip hemiarthroplasty  Antimicrobials:  Anti-infectives (From  admission, onward)   Start     Dose/Rate Route Frequency Ordered Stop   04/14/19 0030  ceFAZolin (ANCEF) IVPB 1 g/50 mL premix     1 g 100 mL/hr over 30 Minutes Intravenous Every 8 hours 04/14/19 0025 04/14/19 1436   04/13/19 1700  ceFAZolin (ANCEF) IVPB 2g/100 mL premix     2 g 200 mL/hr over 30 Minutes Intravenous  Once 04/13/19 1641 04/13/19 1657   04/13/19 1643  ceFAZolin (ANCEF) 2-4 GM/100ML-% IVPB    Note to Pharmacy: Georgena Spurling   : cabinet override      04/13/19 1643 04/13/19 1647   04/13/19 0600  ceFAZolin (ANCEF) IVPB 2g/100 mL premix     2 g 200 mL/hr over 30 Minutes Intravenous On call to O.R. 04/12/19 1353 04/12/19 1531   04/12/19 2130  ceFAZolin (ANCEF) IVPB 2g/100 mL premix     2 g 200 mL/hr over 30 Minutes Intravenous Every 6 hours 04/12/19 1850 04/13/19 0413      Subjective:  Patient seen and examined the bedside this morning.  Hemodynamically stable.  No new issues.  Objective: Vitals:   04/14/19 2019 04/15/19 0420 04/15/19 0533 04/15/19 0600  BP: (!) 96/50 (!) 119/57    Pulse: 72 76    Resp: 16 14    Temp: 98.1 F (36.7 C) 98.1 F (36.7 C)    TempSrc: Oral Oral    SpO2: 98% 96% (!) 87% 94%  Weight:      Height:        Intake/Output Summary (Last 24 hours) at 04/15/2019 1146 Last data filed at 04/15/2019 1110 Gross per 24 hour  Intake 829.18 ml  Output 2000  ml  Net -1170.82 ml   Filed Weights   04/11/19 2235 04/12/19 1353 04/13/19 1613  Weight: 51 kg 51 kg 51 kg    Examination:  General exam: Very pleasant elderly female, deconditioned/debilitated  HEENT:PERRL, Ear/Nose normal on gross exam,frail Respiratory system: Bilateral equal air entry, normal vesicular breath sounds, no wheezes or crackles  Cardiovascular system: S1 & S2 heard, RRR. No JVD, murmurs, rubs, gallops or clicks. No pedal edema. Gastrointestinal system: Abdomen is nondistended, soft and nontender. No organomegaly or masses felt. Normal bowel sounds heard. Central nervous  system: Alert and oriented. No focal neurological deficits. Extremities: Clean surgical wound on the left hip, dressing applied on left upper extremity. Skin: Minor skin tears   Data Reviewed: I have personally reviewed following labs and imaging studies  CBC: Recent Labs  Lab 04/12/19 0327 04/13/19 0305 04/14/19 0307 04/15/19 0636  WBC 9.1 9.3 7.3 6.6  NEUTROABS  --  7.3  --   --   HGB 12.5 11.1* 10.3* 9.7*  HCT 41.5 36.7 33.9* 31.3*  MCV 99.5 101.7* 99.7 98.4  PLT 132* 107* 89* [id number]*   Basic Metabolic Panel: Recent Labs  Lab 04/12/19 0327 04/13/19 0305 04/14/19 0307 04/15/19 0259  NA 139 135 135 135  K 4.3 4.9 3.9 4.2  CL 104 100 104 102  CO2 28 26 22 25   GLUCOSE 106* 107* 93 108*  BUN 11 11 9 16   CREATININE 1.06* 1.05* 0.89 1.03*  CALCIUM 8.6* 8.3* 8.1* 8.3*   GFR: Estimated Creatinine Clearance: 31.6 mL/min (A) (by C-G formula based on SCr of 1.03 mg/dL (H)). Liver Function Tests: Recent Labs  Lab 04/12/19 0327  ALBUMIN 3.7   No results for input(s): LIPASE, AMYLASE in the last 168 hours. No results for input(s): AMMONIA in the last 168 hours. Coagulation Profile: Recent Labs  Lab 04/12/19 1330  INR 0.9   Cardiac Enzymes: No results for input(s): CKTOTAL, CKMB, CKMBINDEX, TROPONINI in the last 168 hours. BNP (last 3 results) No results for input(s): PROBNP in the last 8760 hours. HbA1C: No results for input(s): HGBA1C in the last 72 hours. CBG: No results for input(s): GLUCAP in the last 168 hours. Lipid Profile: No results for input(s): CHOL, HDL, LDLCALC, TRIG, CHOLHDL, LDLDIRECT in the last 72 hours. Thyroid Function Tests: No results for input(s): TSH, T4TOTAL, FREET4, T3FREE, THYROIDAB in the last 72 hours. Anemia Panel: No results for input(s): VITAMINB12, FOLATE, FERRITIN, TIBC, IRON, RETICCTPCT in the last 72 hours. Sepsis Labs: No results for input(s): PROCALCITON, LATICACIDVEN in the last 168 hours.  Recent Results (from the past 240  hour(s))  Surgical PCR screen     Status: None   Collection Time: 04/12/19 12:46 AM   Specimen: Nasal Mucosa; Nasal Swab  Result Value Ref Range Status   MRSA, PCR NEGATIVE NEGATIVE Final   Staphylococcus aureus NEGATIVE NEGATIVE Final    Comment: (NOTE) The Xpert SA Assay (FDA approved for NASAL specimens in patients 68 years of age and older), is one component of a comprehensive surveillance program. It is not intended to diagnose infection nor to guide or monitor treatment. Performed at [facility], [place] [address], [place], [place] [address]   SARS CORONAVIRUS 2 (TAT 6-24 HRS) Nasopharyngeal Nasopharyngeal Swab     Status: None   Collection Time: 04/12/19 12:48 AM   Specimen: Nasopharyngeal Swab  Result Value Ref Range Status   SARS Coronavirus 2 NEGATIVE NEGATIVE Final    Comment: (NOTE) SARS-CoV-2 target nucleic acids are  NOT DETECTED. The SARS-CoV-2 RNA is generally detectable in upper and lower respiratory specimens during the acute phase of infection. Negative results do not preclude SARS-CoV-2 infection, do not rule out co-infections with other pathogens, and should not be used as the sole basis for treatment or other patient management decisions. Negative results must be combined with clinical observations, patient history, and epidemiological information. The expected result is Negative. Fact Sheet for Patients: SugarRoll.be Fact Sheet for Healthcare Providers: https://www.woods-mathews.com/ This test is not yet approved or cleared by the [place] FDA and  has been authorized for detection and/or diagnosis of SARS-CoV-2 by FDA under an Emergency Use Authorization (EUA). This EUA will remain  in effect (meaning this test can be used) for the duration of the COVID-19 declaration under Section 56 4(b)(1) of the Act, 21 U.S.C. section 360bbb-3(b)(1), unless the authorization is terminated or revoked  sooner. Performed at [facility], [place] [address] [place], [place] [address]          Radiology Studies: No results found.      Scheduled Meds: . aspirin  81 mg Oral Daily  . atorvastatin  80 mg Oral QHS  . clonazePAM  1 mg Oral TID  . docusate sodium  100 mg Oral BID  . ferrous sulfate  325 mg Oral TID PC  . levothyroxine  75 mcg Oral Q0600  . nicotine  14 mg Transdermal Daily  . polyethylene glycol  17 g Oral Daily  . sertraline  50 mg Oral BID  . Vitamin D (Ergocalciferol)  50,000 Units Oral Q7 days   Continuous Infusions: . methocarbamol (ROBAXIN) IV       LOS: 4 days    Time spent: 25 mins.More than 50% of that time was spent in counseling and/or coordination of care.      Shelly Coss, MD [facility] Pager [phone]  If 7PM-7AM, please contact night-coverage [email or web address] Password [email or web address] 04/15/2019, 11:46 AM

## 2019-04-15 NOTE — TOC Progression Note (Signed)
Transition of Care [facility]) - Progression Note    Patient Details  Name: Kara Mcdowell MRN: [id number] Date of Birth: [birth year]  Transition of Care [facility]) CM/SW Contact  Purcell Mouton, RN Phone Number: 04/15/2019, 2:30 PM  Clinical Narrative:    Pt will need a COVID test, facility Admission's Cord. States will need test for 48 hours not longer. PASRR number is still pending.         Expected Discharge Plan and Services                                                 Social Determinants of Health (SDOH) Interventions    Readmission Risk Interventions No flowsheet data found.

## 2019-04-15 NOTE — TOC Progression Note (Signed)
Transition of Care [facility]) - Progression Note    Patient Details  Name: Corrina Steffensen MRN: [id number] Date of Birth: [birth year]  Transition of Care [facility]) CM/SW [place], LCSW Phone Number: 04/15/2019, 1:03 PM  Clinical Narrative:   CSW met patient at bedside to discuss discharge plans. Patient stated she would like [facility]. CSW spoke with admission coordinator Mardene Celeste and she stated they will be able to take patient as long as patient receives her [place] PASRR and has a negative covid test within 48 hours. Patient at this time still does not have a PASRR number from the state and she will need that prior to discharge          Expected Discharge Plan and Services                                                 Social Determinants of Health (SDOH) Interventions    Readmission Risk Interventions No flowsheet data found.

## 2019-04-15 NOTE — Progress Notes (Signed)
   Subjective: 2 Days Post-Op Procedure(s) (LRB): OPEN REDUCTION INTERNAL FIXATION (ORIF) WRIST FRACTURE (Left) and 3 days s/p left hip hemiarthroplasty Patient reports pain as mild.   Patient seen in rounds for Dr. Alvan Dame. Patient is well, and has had no acute complaints or problems other than discomfort in the left wrist. She denies pain in the hip at rest.  We will continue therapy today.   Objective: Vital signs in last 24 hours: Temp:  [98.1 F (36.7 C)-98.4 F (36.9 C)] 98.1 F (36.7 C) (10/23 0420) Pulse Rate:  [72-76] 76 (10/23 0420) Resp:  [14-16] 14 (10/23 0420) BP: (96-119)/(50-57) 119/57 (10/23 0420) SpO2:  [87 %-100 %] 94 % (10/23 0600)  Intake/Output from previous day:  Intake/Output Summary (Last 24 hours) at 04/15/2019 1210 Last data filed at 04/15/2019 1110 Gross per 24 hour  Intake 829.18 ml  Output 2000 ml  Net -1170.82 ml     Intake/Output this shift: Total I/O In: -  Out: 900 [Urine:900]  Labs: Recent Labs    04/13/19 0305 04/14/19 0307 04/15/19 0636  HGB 11.1* 10.3* 9.7*   Recent Labs    04/14/19 0307 04/15/19 0636  WBC 7.3 6.6  RBC 3.40* 3.18*  HCT 33.9* 31.3*  PLT 89* 108*   Recent Labs    04/14/19 0307 04/15/19 0259  NA 135 135  K 3.9 4.2  CL 104 102  CO2 22 25  BUN 9 16  CREATININE 0.89 1.03*  GLUCOSE 93 108*  CALCIUM 8.1* 8.3*   Recent Labs    04/12/19 1330  INR 0.9    Exam: General - Patient is Alert and Oriented Extremity - Neurologically intact Sensation intact distally Intact pulses distally Dorsiflexion/Plantar flexion intact Dressing - dressing C/D/I Motor Function - intact, moving foot and toes well on exam.   Past Medical History:  Diagnosis Date  . Anxiety   . COPD (chronic obstructive pulmonary disease) ([place])   . Depression   . Hypertension     Assessment/Plan: 2 Days Post-Op Procedure(s) (LRB): OPEN REDUCTION INTERNAL FIXATION (ORIF) WRIST FRACTURE (Left) Active Problems:   Essential  hypertension, benign   Hypothyroidism   Closed left hip fracture (HCC)  Estimated body mass index is 19.31 kg/m as calculated from the following:   Height as of this encounter: 5\' 4"  (1.626 m).   Weight as of this encounter: 51 kg. Advance diet Up with therapy  DVT Prophylaxis - Aspirin Weight bearing as tolerated LLE Hip precautions discussed with patient  Plan is to go Skilled nursing facility after hospital stay. Patient is cleared for discharge from an orthopaedic standpoint, she may discharge when medically stable.  RTC in 2 weeks likely a Wednesday to see [facility] same day Aspirin for DVT prophylaxis for 4 weeks  Griffith Citron, [facility] [phone] 04/15/2019, 12:10 PM

## 2019-04-15 NOTE — Progress Notes (Signed)
Physical Therapy Treatment Patient Details Name: Kara Mcdowell MRN: [id number] DOB: 31-Jul-1929 Today's Date: 04/15/2019    History of Present Illness 85 yo female admitted 10/20 post-fall at home, sustaining L femoral neck displaced fracture and L radial fracture. S/p L hip hemiarthroplasty posterior approach on 10/20, awaiting L radius surgery. PMH includes R hemiarthroplasty 2015 due to fall at home, COPD, depression, HTN.    PT Comments    Assisted OOB.  General bed mobility comments: assist for LLE and trunk with 75% VC's to avoid WBing through L UE (pushing on bed).  General transfer comment: 75% VC's to avoid hip flex > 90 degrees and safety with turns.  Assisted from elevated bed to [facility]. General Gait Details: 75% VC's on upright posture, safety with turns and proper use of L platform.  Pt will need ST Rehab at SNF.   Follow Up Recommendations  SNF;Supervision/Assistance - 24 hour     Equipment Recommendations  None recommended by PT    Recommendations for Other Services       Precautions / Restrictions Precautions Precautions: Fall;Posterior Hip Precaution Booklet Issued: Yes (comment) Precaution Comments: reinforced posterior THP Restrictions Weight Bearing Restrictions: Yes LUE Weight Bearing: Non weight bearing LLE Weight Bearing: Weight bearing as tolerated Other Position/Activity Restrictions: L UE cast NWB through wrist "may use platform" per Ortho    Mobility  Bed Mobility Overal bed mobility: Needs Assistance Bed Mobility: Supine to Sit     Supine to sit: Mod assist     General bed mobility comments: assist for LLE and trunk with 75% VC's to avoid WBing through L UE (pushing on bed)  Transfers Overall transfer level: Needs assistance Equipment used: Rolling walker (2 wheeled) Transfers: Sit to/from Stand Sit to Stand: Min assist         General transfer comment: 75% VC's to avoid hip flex > 90 degrees and safety with turns.  Assisted from elevated  bed to [facility].  Ambulation/Gait Ambulation/Gait assistance: Min assist;Mod assist Gait Distance (Feet): 7 Feet Assistive device: Left platform walker Gait Pattern/deviations: Step-to pattern;Decreased stance time - left Gait velocity: decreased   General Gait Details: 75% VC's on upright posture, safety with turns and proper use of L platform   Stairs             Wheelchair Mobility    Modified Rankin (Stroke Patients Only)       Balance                                            Cognition Arousal/Alertness: Awake/alert Behavior During Therapy: WFL for tasks assessed/performed                                   General Comments: did not recall any of posterior THPS after reviewing 3xs but is following repeat functional commands      Exercises      General Comments        Pertinent Vitals/Pain Pain Assessment: Faces Faces Pain Scale: Hurts a little bit Pain Location: L hip Pain Descriptors / Indicators: Sore Pain Intervention(s): Monitored during session;Premedicated before session;Repositioned;Ice applied    Home Living                      Prior Function  PT Goals (current goals can now be found in the care plan section)      Frequency           PT Plan      Co-evaluation              AM-PAC PT "6 Clicks" Mobility   Outcome Measure  Help needed turning from your back to your side while in a flat bed without using bedrails?: A Lot Help needed moving from lying on your back to sitting on the side of a flat bed without using bedrails?: A Lot Help needed moving to and from a bed to a chair (including a wheelchair)?: A Lot Help needed standing up from a chair using your arms (e.g., wheelchair or bedside chair)?: A Lot Help needed to walk in hospital room?: Total Help needed climbing 3-5 steps with a railing? : Total 6 Click Score: 10    End of Session               Time:  [id number] PT Time Calculation (min) (ACUTE ONLY): 33 min  Charges:  $Gait Training: 8-22 mins $Therapeutic Activity: 8-22 mins                     Rica Koyanagi  PTA Acute  Rehabilitation Services Pager      [phone] Office      [phone]

## 2019-04-16 NOTE — Progress Notes (Addendum)
   Subjective: 3 Days Post-Op Procedure(s) (LRB): OPEN REDUCTION INTERNAL FIXATION (ORIF) WRIST FRACTURE (Left) and 4 days s/p left hip hemiarthroplasty Patient reports pain as mild.   Patient seen in rounds for Dr. Alvan Dame and Dr. Caralyn Guile.  Patient is well, and has had no acute complaints or problems other than discomfort in the left hip. No acute events overnight. Voiding without difficulty, positive flatus. Patient denies CP, SHOB.  We will continue therapy today.   Objective: Vital signs in last 24 hours: Temp:  [98.1 F (36.7 C)-99.6 F (37.6 C)] 98.1 F (36.7 C) (10/24 0643) Pulse Rate:  [70-83] 70 (10/24 0643) Resp:  [16-18] 16 (10/24 0643) BP: (121-155)/(61-74) 155/74 (10/24 0643) SpO2:  [85 %-97 %] 95 % (10/24 0643)  Intake/Output from previous day:  Intake/Output Summary (Last 24 hours) at 04/16/2019 0814 Last data filed at 04/16/2019 0643 Gross per 24 hour  Intake 660 ml  Output 1576 ml  Net -916 ml     Intake/Output this shift: No intake/output data recorded.  Labs: Recent Labs    04/14/19 0307 04/15/19 0636  HGB 10.3* 9.7*   Recent Labs    04/14/19 0307 04/15/19 0636  WBC 7.3 6.6  RBC 3.40* 3.18*  HCT 33.9* 31.3*  PLT 89* 108*   Recent Labs    04/14/19 0307 04/15/19 0259  NA 135 135  K 3.9 4.2  CL 104 102  CO2 22 25  BUN 9 16  CREATININE 0.89 1.03*  GLUCOSE 93 108*  CALCIUM 8.1* 8.3*   No results for input(s): LABPT, INR in the last 72 hours.  Exam: General - Patient is Alert and Oriented Extremity - Neurologically intact Sensation intact distally Intact pulses distally Dorsiflexion/Plantar flexion intact Dressing - dressing C/D/I Motor Function - intact, moving foot and toes well on exam. Moving wrist and fingers well on exam.   Past Medical History:  Diagnosis Date  . Anxiety   . COPD (chronic obstructive pulmonary disease) ([place])   . Depression   . Hypertension     Assessment/Plan: 3 Days Post-Op Procedure(s) (LRB): OPEN  REDUCTION INTERNAL FIXATION (ORIF) WRIST FRACTURE (Left) Active Problems:   Essential hypertension, benign   Hypothyroidism   Closed left hip fracture (HCC)  Estimated body mass index is 19.31 kg/m as calculated from the following:   Height as of this encounter: 5\' 4"  (1.626 m).   Weight as of this encounter: 51 kg. Advance diet Up with therapy  DVT Prophylaxis - Aspirin Weight bearing as tolerated LLE Weight bearing through elbow with platform walker on LUE Continue therapy Hip precautions discussed with patient  Plan is to go Skilled nursing facility after hospital stay. Plan for discharge to SNF when able. Awaiting PASRR number, will need COVID test when this is received. Continue working with therapy. Continue pain management. Analgesic and DVT prophylaxis prescriptions sent to pharmacy.  RTC in 2 weeks likely a Wednesday to see Alvan Dame and [facility] same day Aspirin 81 mg BID for DVT prophylaxis for 4 weeks  Griffith Citron, PA-C Orthopedic Surgery [phone] 04/16/2019, 8:14 AM

## 2019-04-16 NOTE — Progress Notes (Signed)
PROGRESS NOTE    Kara Mcdowell  [id number] DOB: [birth year] DOA: 04/11/2019 PCP: Betsey Holiday, MD   Brief Narrative:  Patient is 85 year old female with history of hypertension, hyperlipidemia, hypothyroidism who presented with a fall from her home after stepping off of a stool.  She fell on her left side.  She was brought to the emergency department at [facility] where she was found to have left femoral neck fracture and left radiusfracture.  She was transferred to [facility] for surgery.  Orthopedics following and she underwent  ORIF for left hip fracture and left radius fracture.  She is medically stable for discharge to skilled nursing facility as soon as the bed is available.  Assessment & Plan:   Active Problems:   Essential hypertension, benign   Hypothyroidism   Closed left hip fracture (HCC)   Closed left hip fracture: Orthopedics was following.  Underwent left hip hemiarthoplasty .  Pain well controlled this mrng.  Continue pain management Patient ambulates with cane at home.  She lives by herself. PT/OT recommended skilled nursing facility.  Started on aspirin for DVT prophylaxis which will be continued for 4 weeks..  She needs to follow-up with orthopedics in 2 weeks after discharge.  Left radius fracture:Xray Distal left radial fracture with continued mild displaced fracture fragments and angulation.S/P ORIF  Vitamin D deficiency: Started on supplementation  Hypertension: BP currently stable  Hypothyroidism: Continue Synthyroid  Hyperlipidemia: On Lipitor at home.  Anxiety/depression: On clonazepam, sertraline at home.         DVT prophylaxis: SCD,aspirin Code Status: Full Family Communication: Tried to call niece and nephew by calling the number provided by the patient, none of them received. Disposition Plan: skilled nursing facility as soon as the bed is available.PASSAR pending.SW following   Consultants: Ortho  Procedures: Left hip  hemiarthroplasty  Antimicrobials:  Anti-infectives (From admission, onward)   Start     Dose/Rate Route Frequency Ordered Stop   04/14/19 0030  ceFAZolin (ANCEF) IVPB 1 g/50 mL premix     1 g 100 mL/hr over 30 Minutes Intravenous Every 8 hours 04/14/19 0025 04/14/19 1436   04/13/19 1700  ceFAZolin (ANCEF) IVPB 2g/100 mL premix     2 g 200 mL/hr over 30 Minutes Intravenous  Once 04/13/19 1641 04/13/19 1657   04/13/19 1643  ceFAZolin (ANCEF) 2-4 GM/100ML-% IVPB    Note to Pharmacy: Georgena Spurling   : cabinet override      04/13/19 1643 04/13/19 1647   04/13/19 0600  ceFAZolin (ANCEF) IVPB 2g/100 mL premix     2 g 200 mL/hr over 30 Minutes Intravenous On call to O.R. 04/12/19 1353 04/12/19 1531   04/12/19 2130  ceFAZolin (ANCEF) IVPB 2g/100 mL premix     2 g 200 mL/hr over 30 Minutes Intravenous Every 6 hours 04/12/19 1850 04/13/19 0413      Subjective:  Patient seen and examined the bedside this morning.  Hemodynamically stable.  No new issues or complaints.  Pain well controlled.  Objective: Vitals:   04/15/19 1337 04/15/19 2031 04/15/19 2300 04/16/19 0643  BP: 121/72 121/61  (!) 155/74  Pulse: 72 83  70  Resp: 18   16  Temp: 98.6 F (37 C) 99.6 F (37.6 C)  98.1 F (36.7 C)  TempSrc: Oral Oral  Oral  SpO2: (!) 85% (!) 89% 97% 95%  Weight:      Height:        Intake/Output Summary (Last 24 hours) at 04/16/2019 1130 Last  data filed at 04/16/2019 [id number] Gross per 24 hour  Intake 660 ml  Output 676 ml  Net -16 ml   Filed Weights   04/11/19 2235 04/12/19 1353 04/13/19 1613  Weight: 51 kg 51 kg 51 kg    Examination:  General exam: Appears calm and comfortable ,Not in distress, very pleasant elderly female  HEENT:PERRL,Oral mucosa moist, Ear/Nose normal on gross exam Respiratory system: Bilateral equal air entry, normal vesicular breath sounds, no wheezes or crackles  Cardiovascular system: S1 & S2 heard, RRR. No JVD, murmurs, rubs, gallops or clicks.  Gastrointestinal system: Abdomen is nondistended, soft and nontender. No organomegaly or masses felt. Normal bowel sounds heard. Central nervous system: Alert and oriented. No focal neurological deficits. Extremities: Clean surgical wound on the left hip, dressing applied on left upper extremity. Skin: No rashes, lesions or ulcers,no icterus ,no pallor   Data Reviewed: I have personally reviewed following labs and imaging studies  CBC: Recent Labs  Lab 04/12/19 0327 04/13/19 0305 04/14/19 0307 04/15/19 0636  WBC 9.1 9.3 7.3 6.6  NEUTROABS  --  7.3  --   --   HGB 12.5 11.1* 10.3* 9.7*  HCT 41.5 36.7 33.9* 31.3*  MCV 99.5 101.7* 99.7 98.4  PLT 132* 107* 89* [id number]*   Basic Metabolic Panel: Recent Labs  Lab 04/12/19 0327 04/13/19 0305 04/14/19 0307 04/15/19 0259  NA 139 135 135 135  K 4.3 4.9 3.9 4.2  CL 104 100 104 102  CO2 28 26 22 25   GLUCOSE 106* 107* 93 108*  BUN 11 11 9 16   CREATININE 1.06* 1.05* 0.89 1.03*  CALCIUM 8.6* 8.3* 8.1* 8.3*   GFR: Estimated Creatinine Clearance: 31.6 mL/min (A) (by C-G formula based on SCr of 1.03 mg/dL (H)). Liver Function Tests: Recent Labs  Lab 04/12/19 0327  ALBUMIN 3.7   No results for input(s): LIPASE, AMYLASE in the last 168 hours. No results for input(s): AMMONIA in the last 168 hours. Coagulation Profile: Recent Labs  Lab 04/12/19 1330  INR 0.9   Cardiac Enzymes: No results for input(s): CKTOTAL, CKMB, CKMBINDEX, TROPONINI in the last 168 hours. BNP (last 3 results) No results for input(s): PROBNP in the last 8760 hours. HbA1C: No results for input(s): HGBA1C in the last 72 hours. CBG: No results for input(s): GLUCAP in the last 168 hours. Lipid Profile: No results for input(s): CHOL, HDL, LDLCALC, TRIG, CHOLHDL, LDLDIRECT in the last 72 hours. Thyroid Function Tests: No results for input(s): TSH, T4TOTAL, FREET4, T3FREE, THYROIDAB in the last 72 hours. Anemia Panel: No results for input(s): VITAMINB12, FOLATE,  FERRITIN, TIBC, IRON, RETICCTPCT in the last 72 hours. Sepsis Labs: No results for input(s): PROCALCITON, LATICACIDVEN in the last 168 hours.  Recent Results (from the past 240 hour(s))  Surgical PCR screen     Status: None   Collection Time: 04/12/19 12:46 AM   Specimen: Nasal Mucosa; Nasal Swab  Result Value Ref Range Status   MRSA, PCR NEGATIVE NEGATIVE Final   Staphylococcus aureus NEGATIVE NEGATIVE Final    Comment: (NOTE) The Xpert SA Assay (FDA approved for NASAL specimens in patients 72 years of age and older), is one component of a comprehensive surveillance program. It is not intended to diagnose infection nor to guide or monitor treatment. Performed at [facility], [place] [address] [place], [place] [address]   SARS CORONAVIRUS 2 (TAT 6-24 HRS) Nasopharyngeal Nasopharyngeal Swab     Status: None   Collection Time: 04/12/19 12:48 AM   Specimen:  Nasopharyngeal Swab  Result Value Ref Range Status   SARS Coronavirus 2 NEGATIVE NEGATIVE Final    Comment: (NOTE) SARS-CoV-2 target nucleic acids are NOT DETECTED. The SARS-CoV-2 RNA is generally detectable in upper and lower respiratory specimens during the acute phase of infection. Negative results do not preclude SARS-CoV-2 infection, do not rule out co-infections with other pathogens, and should not be used as the sole basis for treatment or other patient management decisions. Negative results must be combined with clinical observations, patient history, and epidemiological information. The expected result is Negative. Fact Sheet for Patients: SugarRoll.be Fact Sheet for Healthcare Providers: https://www.woods-mathews.com/ This test is not yet approved or cleared by the [place] FDA and  has been authorized for detection and/or diagnosis of SARS-CoV-2 by FDA under an Emergency Use Authorization (EUA). This EUA will remain  in effect (meaning this test can be  used) for the duration of the COVID-19 declaration under Section 56 4(b)(1) of the Act, 21 U.S.C. section 360bbb-3(b)(1), unless the authorization is terminated or revoked sooner. Performed at [facility], [place] [address], [place], [place] [address]          Radiology Studies: No results found.      Scheduled Meds: . aspirin  81 mg Oral Daily  . atorvastatin  80 mg Oral QHS  . clonazePAM  1 mg Oral TID  . docusate sodium  100 mg Oral BID  . ferrous sulfate  325 mg Oral TID PC  . levothyroxine  75 mcg Oral Q0600  . nicotine  14 mg Transdermal Daily  . polyethylene glycol  17 g Oral Daily  . sertraline  50 mg Oral BID  . Vitamin D (Ergocalciferol)  50,000 Units Oral Q7 days   Continuous Infusions: . methocarbamol (ROBAXIN) IV       LOS: 5 days    Time spent: 25 mins.More than 50% of that time was spent in counseling and/or coordination of care.      Shelly Coss, MD [facility] Pager [phone]  If 7PM-7AM, please contact night-coverage [email or web address] Password [email or web address] 04/16/2019, 11:30 AM

## 2019-04-17 DIAGNOSIS — S72002A Fracture of unspecified part of neck of left femur, initial encounter for closed fracture: Secondary | ICD-10-CM | POA: Diagnosis not present

## 2019-04-17 LAB — CBC WITH DIFFERENTIAL/PLATELET
Abs Immature Granulocytes: 0.02 10*3/uL (ref 0.00–0.07)
Basophils Absolute: 0 10*3/uL (ref 0.0–0.1)
Basophils Relative: 0 %
Eosinophils Absolute: 0.2 10*3/uL (ref 0.0–0.5)
Eosinophils Relative: 3 %
HCT: 30.2 % — ABNORMAL LOW (ref 36.0–46.0)
Hemoglobin: 9.5 g/dL — ABNORMAL LOW (ref 12.0–15.0)
Immature Granulocytes: 0 %
Lymphocytes Relative: 33 %
Lymphs Abs: 2.1 10*3/uL (ref 0.7–4.0)
MCH: 30.4 pg (ref 26.0–34.0)
MCHC: 31.5 g/dL (ref 30.0–36.0)
MCV: 96.5 fL (ref 80.0–100.0)
Monocytes Absolute: 0.7 10*3/uL (ref 0.1–1.0)
Monocytes Relative: 12 %
Neutro Abs: 3.2 10*3/uL (ref 1.7–7.7)
Neutrophils Relative %: 52 %
Platelets: 168 10*3/uL (ref 150–400)
RBC: 3.13 MIL/uL — ABNORMAL LOW (ref 3.87–5.11)
RDW: 14 % (ref 11.5–15.5)
WBC: 6.2 10*3/uL (ref 4.0–10.5)
nRBC: 0 % (ref 0.0–0.2)

## 2019-04-17 NOTE — Plan of Care (Signed)
  Problem: Clinical Measurements: Goal: Will remain free from infection Outcome: Progressing Goal: Cardiovascular complication will be avoided Outcome: Progressing   Problem: Pain Managment: Goal: General experience of comfort will improve Outcome: Progressing   Problem: Safety: Goal: Ability to remain free from injury will improve Outcome: Progressing   Problem: Activity: Goal: Ability to avoid complications of mobility impairment will improve Outcome: Progressing Goal: Ability to tolerate increased activity will improve Outcome: Progressing

## 2019-04-17 NOTE — Progress Notes (Signed)
PROGRESS NOTE    Kara Mcdowell  [id number] DOB: 1930-05-10 DOA: 04/11/2019 PCP: Betsey Holiday, MD   Brief Narrative:  Patient is 85 year old female with history of hypertension, hyperlipidemia, hypothyroidism who presented with a fall from her home after stepping off of a stool.  She fell on her left side.  She was brought to the emergency department at [facility] where she was found to have left femoral neck fracture and left radiusfracture.  She was transferred to [facility] for surgery.  Orthopedics following and she underwent  ORIF for left hip fracture and left radius fracture.  She is medically stable for discharge to skilled nursing facility as soon as the bed is available.   Assessment & Plan:   Active Problems:   Essential hypertension, benign   Hypothyroidism   Closed left hip fracture (HCC)   Closed left hip fracture: Orthopedics was following.  Underwent left hip hemiarthoplasty .  Pain well controlled this mrng.  Continue pain management Patient ambulates with cane at home.  She lives by herself. PT/OT recommended skilled nursing facility.  Started on aspirin for DVT prophylaxis which will be continued for 4 weeks..  She needs to follow-up with orthopedics in 2 weeks after discharge. Ordered COVID today  Left radius fracture:Xray Distal left radial fracture with continued mild displaced fracture fragments and angulation.S/P ORIF  Vitamin D deficiency: Started on supplementation  Hypertension: BP currently stable  Hypothyroidism: Continue Synthyroid  Hyperlipidemia: On Lipitor at home.  Anxiety/depression: On clonazepam, sertraline at home.  DVT prophylaxis: SCD/Compression stockings  Code Status: Full    Code Status Orders  (From admission, onward)         Start     Ordered   04/11/19 2255  Full code  Continuous     04/11/19 2256        Code Status History    Date Active Date Inactive Code Status Order ID Comments User Context   11/06/2013 1705 11/09/2013 1715 Full Code [id number]  Meredith Pel, MD Inpatient   11/05/2013 1716 11/06/2013 1705 Full Code [id number]  Allyne Gee, MD Inpatient   Advance Care Planning Activity     Family Communication: none today Disposition Plan:   Patient remained inpatient awaiting transition to skilled nursing facility for intensive postoperative hip fracture pathway protocol.  Patient signs stable for discharge to home Consults called: None Admission status: Inpatient   Consultants:   ortho  Procedures:  Dg Wrist 2 Views Left  Result Date: 04/12/2019 CLINICAL DATA:  Wrist fracture, follow-up EXAM: LEFT WRIST - 2 VIEW COMPARISON:  04/11/2019 FINDINGS: In cast views of the left wrist again demonstrate the comminuted intra-articular displaced distal left radial fracture. Continued mild displacement of fracture fragments and slight posterior angulation noted on the lateral view. IMPRESSION: Distal left radial fracture with continued mild displaced fracture fragments and angulation. Electronically Signed   By: Rolm Baptise M.D.   On: 04/12/2019 12:14   Dg Abd 1 View  Result Date: 04/11/2019 CLINICAL DATA:  Constipation EXAM: ABDOMEN - 1 VIEW COMPARISON:  None. FINDINGS: Lung bases are clear. Dextroscoliosis of the spine. Nonobstructed gas pattern with mild stool. Right hip replacement. IMPRESSION: Nonobstructed gas pattern with mild stool Electronically Signed   By: Donavan Foil M.D.   On: 04/11/2019 23:50   Pelvis Portable  Result Date: 04/12/2019 CLINICAL DATA:  Left hip arthroplasty EXAM: PORTABLE PELVIS 1-2 VIEWS COMPARISON:  Radiograph 04/12/2019 FINDINGS: Patient is post left hip hemiarthroplasty. Expected alignment of the articulating  femoral component. Right hip hemiarthroplasty is noted as well. Mild bilateral hip osteoarthrosis is present with sclerotic changes of the acetabuli. Postsurgical soft tissue changes are noted over the left hip with soft tissue gas, swelling  and left hip intra-articular gas as well. Remaining soft tissues are unremarkable. IMPRESSION: 1. Expected postoperative changes of recent left hip hemiarthroplasty without evidence of hardware complication. 2. Mild bilateral hip osteoarthrosis, similar to prior study. Electronically Signed   By: Lovena Le M.D.   On: 04/12/2019 17:31   Dg Pelvis Portable  Result Date: 04/12/2019 CLINICAL DATA:  Fall.  Left hip fracture. EXAM: PORTABLE PELVIS 1-2 VIEWS COMPARISON:  Abdomen 04/11/2019. FINDINGS: Angulated fracture of the left femoral neck is noted. Degenerative change lumbar spine and left hip. Total right hip replacement. Pelvic calcifications consistent phleboliths. IMPRESSION: Angulated fracture of the left femoral neck. Electronically Signed   By: Marcello Moores  Register   On: 04/12/2019 07:55   Chest Portable 1 View  Result Date: 04/11/2019 CLINICAL DATA:  Preop hip fracture EXAM: PORTABLE CHEST 1 VIEW COMPARISON:  11/09/2013 FINDINGS: Stable scarring at the bilateral lung bases. No acute consolidation or pleural effusion. Stable cardiomediastinal silhouette. Aortic atherosclerosis. No pneumothorax. IMPRESSION: No active disease.  Stable scarring at the bilateral lung bases Electronically Signed   By: Donavan Foil M.D.   On: 04/11/2019 23:51     Antimicrobials:   Perioperative per Ortho   Subjective: Doing well overnight No acute events, reports pain is controlled Multiple nonspecific complaints about various staff members  Objective: Vitals:   04/16/19 0643 04/16/19 1431 04/16/19 2033 04/17/19 0533  BP: (!) 155/74 123/88 138/69 (!) 153/66  Pulse: 70 92 86 80  Resp: 16 18 16 16   Temp: 98.1 F (36.7 C) 98.2 F (36.8 C) 98.6 F (37 C) 97.9 F (36.6 C)  TempSrc: Oral Oral Oral Oral  SpO2: 95% 94% 95% 91%  Weight:      Height:        Intake/Output Summary (Last 24 hours) at 04/17/2019 1219 Last data filed at 04/17/2019 0951 Gross per 24 hour  Intake 510 ml  Output 1900 ml   Net -1390 ml   Filed Weights   04/11/19 2235 04/12/19 1353 04/13/19 1613  Weight: 51 kg 51 kg 51 kg    Examination:  General exam: Appears calm mildly anxious and oppositional Respiratory system: Clear to auscultation. Respiratory effort normal. Cardiovascular system: S1 & S2 heard, RRR. No JVD, murmurs, rubs, gallops or clicks. No pedal edema. Gastrointestinal system: Abdomen is nondistended, soft and nontender. No organomegaly or masses felt. Normal bowel sounds heard. Central nervous system: Alert and oriented. No focal neurological deficits. Extremities: Moves all 4 extremities freely, limited range of motion on affected surgical extremity secondary to pain, no focal findings. Skin: No rashes, lesions or ulcers Psychiatry: Judgement and insight appear normal. Mood & affect mildly anxious and oppositional.     Data Reviewed: I have personally reviewed following labs and imaging studies  CBC: Recent Labs  Lab 04/12/19 0327 04/13/19 0305 04/14/19 0307 04/15/19 0636 04/17/19 0301  WBC 9.1 9.3 7.3 6.6 6.2  NEUTROABS  --  7.3  --   --  3.2  HGB 12.5 11.1* 10.3* 9.7* 9.5*  HCT 41.5 36.7 33.9* 31.3* 30.2*  MCV 99.5 101.7* 99.7 98.4 96.5  PLT 132* 107* 89* 108* [id number]   Basic Metabolic Panel: Recent Labs  Lab 04/12/19 0327 04/13/19 0305 04/14/19 0307 04/15/19 0259  NA 139 135 135 135  K 4.3 4.9 3.9  4.2  CL 104 100 104 102  CO2 28 26 22 25   GLUCOSE 106* 107* 93 108*  BUN 11 11 9 16   CREATININE 1.06* 1.05* 0.89 1.03*  CALCIUM 8.6* 8.3* 8.1* 8.3*   GFR: Estimated Creatinine Clearance: 31.6 mL/min (A) (by C-G formula based on SCr of 1.03 mg/dL (H)). Liver Function Tests: Recent Labs  Lab 04/12/19 0327  ALBUMIN 3.7   No results for input(s): LIPASE, AMYLASE in the last 168 hours. No results for input(s): AMMONIA in the last 168 hours. Coagulation Profile: Recent Labs  Lab 04/12/19 1330  INR 0.9   Cardiac Enzymes: No results for input(s): CKTOTAL, CKMB,  CKMBINDEX, TROPONINI in the last 168 hours. BNP (last 3 results) No results for input(s): PROBNP in the last 8760 hours. HbA1C: No results for input(s): HGBA1C in the last 72 hours. CBG: No results for input(s): GLUCAP in the last 168 hours. Lipid Profile: No results for input(s): CHOL, HDL, LDLCALC, TRIG, CHOLHDL, LDLDIRECT in the last 72 hours. Thyroid Function Tests: No results for input(s): TSH, T4TOTAL, FREET4, T3FREE, THYROIDAB in the last 72 hours. Anemia Panel: No results for input(s): VITAMINB12, FOLATE, FERRITIN, TIBC, IRON, RETICCTPCT in the last 72 hours. Sepsis Labs: No results for input(s): PROCALCITON, LATICACIDVEN in the last 168 hours.  Recent Results (from the past 240 hour(s))  Surgical PCR screen     Status: None   Collection Time: 04/12/19 12:46 AM   Specimen: Nasal Mucosa; Nasal Swab  Result Value Ref Range Status   MRSA, PCR NEGATIVE NEGATIVE Final   Staphylococcus aureus NEGATIVE NEGATIVE Final    Comment: (NOTE) The Xpert SA Assay (FDA approved for NASAL specimens in patients 76 years of age and older), is one component of a comprehensive surveillance program. It is not intended to diagnose infection nor to guide or monitor treatment. Performed at [facility], [place] [address], [place], [place] [address]   SARS CORONAVIRUS 2 (TAT 6-24 HRS) Nasopharyngeal Nasopharyngeal Swab     Status: None   Collection Time: 04/12/19 12:48 AM   Specimen: Nasopharyngeal Swab  Result Value Ref Range Status   SARS Coronavirus 2 NEGATIVE NEGATIVE Final    Comment: (NOTE) SARS-CoV-2 target nucleic acids are NOT DETECTED. The SARS-CoV-2 RNA is generally detectable in upper and lower respiratory specimens during the acute phase of infection. Negative results do not preclude SARS-CoV-2 infection, do not rule out co-infections with other pathogens, and should not be used as the sole basis for treatment or other patient management decisions. Negative  results must be combined with clinical observations, patient history, and epidemiological information. The expected result is Negative. Fact Sheet for Patients: SugarRoll.be Fact Sheet for Healthcare Providers: https://www.woods-mathews.com/ This test is not yet approved or cleared by the [place] FDA and  has been authorized for detection and/or diagnosis of SARS-CoV-2 by FDA under an Emergency Use Authorization (EUA). This EUA will remain  in effect (meaning this test can be used) for the duration of the COVID-19 declaration under Section 56 4(b)(1) of the Act, 21 U.S.C. section 360bbb-3(b)(1), unless the authorization is terminated or revoked sooner. Performed at [facility], [place] [address], [place], [place] [address]          Radiology Studies: No results found.      Scheduled Meds: . aspirin  81 mg Oral Daily  . atorvastatin  80 mg Oral QHS  . clonazePAM  1 mg Oral TID  . docusate sodium  100 mg Oral BID  .  ferrous sulfate  325 mg Oral TID PC  . levothyroxine  75 mcg Oral Q0600  . nicotine  14 mg Transdermal Daily  . polyethylene glycol  17 g Oral Daily  . sertraline  50 mg Oral BID  . Vitamin D (Ergocalciferol)  50,000 Units Oral Q7 days   Continuous Infusions: . methocarbamol (ROBAXIN) IV       LOS: 6 days    Time spent: 35 min    Nicolette Bang, MD [facility]  If 7PM-7AM, please contact night-coverage  04/17/2019, 12:19 PM

## 2019-04-18 LAB — SARS CORONAVIRUS 2 (TAT 6-24 HRS): SARS Coronavirus 2: NEGATIVE

## 2019-04-18 NOTE — Care Management Important Message (Signed)
Important Message  Patient Details IM Letter given to Velva Harman RN to present to the Patient Name: Kara Mcdowell MRN: [id number] Date of Birth: 03-08-1930   Medicare Important Message Given:  Yes     Kerin Salen 04/18/2019, 10:30 AM

## 2019-04-18 NOTE — TOC Progression Note (Addendum)
Transition of Care [facility]) - Progression Note    Patient Details  Name: Kara Mcdowell MRN: [id number] Date of Birth: 02-19-1930  Transition of Care [facility]) CM/SW Contact  Leeroy Cha, RN Phone Number: 04/18/2019, 12:00 PM  Clinical Narrative:    passar remnains pending requested information sent to reviewer. 78mday request and information faxed to passar       Expected Discharge Plan and Services                                                 Social Determinants of Health (SDOH) Interventions    Readmission Risk Interventions No flowsheet data found.

## 2019-04-18 NOTE — Plan of Care (Signed)
  Problem: Health Behavior/Discharge Planning: Goal: Ability to manage health-related needs will improve Outcome: Progressing   Problem: Clinical Measurements: Goal: Ability to maintain clinical measurements within normal limits will improve Outcome: Progressing Goal: Will remain free from infection Outcome: Progressing Goal: Diagnostic test results will improve Outcome: Progressing Goal: Cardiovascular complication will be avoided Outcome: Progressing   Problem: Activity: Goal: Risk for activity intolerance will decrease Outcome: Progressing   Problem: Elimination: Goal: Will not experience complications related to bowel motility Outcome: Progressing   Problem: Pain Managment: Goal: General experience of comfort will improve Outcome: Progressing   Problem: Safety: Goal: Ability to remain free from injury will improve Outcome: Progressing   Problem: Skin Integrity: Goal: Risk for impaired skin integrity will decrease Outcome: Progressing   Problem: Education: Goal: Understanding of discharge needs will improve Outcome: Progressing   Problem: Activity: Goal: Ability to avoid complications of mobility impairment will improve Outcome: Progressing Goal: Ability to tolerate increased activity will improve Outcome: Progressing   Problem: Pain Management: Goal: Pain level will decrease with appropriate interventions Outcome: Progressing   Problem: Skin Integrity: Goal: Will show signs of wound healing Outcome: Progressing

## 2019-04-18 NOTE — Progress Notes (Signed)
PROGRESS NOTE    Kara Mcdowell  [id number] DOB: 1930-04-12 DOA: 04/11/2019 PCP: Betsey Holiday, MD   Brief Narrative:  Patient is 85 year old female with history of hypertension, hyperlipidemia, hypothyroidism who presented with a fall from her home after stepping off of a stool. She fell on her left side. She was brought to the emergency department at [facility] where she was found to have left femoral neck fracture and left radiusfracture. She was transferred to [facility] for surgery. Orthopedics following and she underwent ORIF for left hip fracture and left radius fracture. She is medically stable for discharge to skilled nursing facility as soon as the bed is available   Assessment & Plan:   Active Problems:   Essential hypertension, benign   Hypothyroidism   Closed left hip fracture (HCC)   Closed left hip fracture: Orthopedicswasfollowing. Underwent left hip hemiarthoplasty . Pain well controlled this mrng. Continue pain management Patient ambulates with cane at home. She lives by herself. PT/OT recommended skilled nursing facility. Started on aspirin for DVT prophylaxis whichwill be continued for 4 weeks.. She needs to follow-up with orthopedics in 2 weeks after discharge.   Negative Covid  Left radius fracture:Xray Distal left radial fracture with continued mild displaced fracture fragments and angulation.S/P ORIF  Vitamin D deficiency: Started on supplementation  Hypertension: BP currently remains stable  Hypothyroidism: Continue Synthyroid  Hyperlipidemia: On Lipitor at home.  Anxiety/depression:On clonazepam, sertraline at home.  DVT prophylaxis: SCD/Compression stockings  Code Status: Full    Code Status Orders  (From admission, onward)         Start     Ordered   04/11/19 2255  Full code  Continuous     04/11/19 2256        Code Status History    Date Active Date Inactive Code Status Order ID Comments User Context   11/06/2013 1705 11/09/2013 1715 Full Code [id number]  Meredith Pel, MD Inpatient   11/05/2013 1716 11/06/2013 1705 Full Code [id number]  Allyne Gee, MD Inpatient   Advance Care Planning Activity     Family Communication: None today discussed in detail with patient Disposition Plan:   Remains inpatient awaiting transfer to skilled nursing facility for intensive postoperative rehab per hip fracture protocol pathway Consults called: None Admission status: Observation   Consultants:   Orthopedics  Procedures:  Dg Wrist 2 Views Left  Result Date: 04/12/2019 CLINICAL DATA:  Wrist fracture, follow-up EXAM: LEFT WRIST - 2 VIEW COMPARISON:  04/11/2019 FINDINGS: In cast views of the left wrist again demonstrate the comminuted intra-articular displaced distal left radial fracture. Continued mild displacement of fracture fragments and slight posterior angulation noted on the lateral view. IMPRESSION: Distal left radial fracture with continued mild displaced fracture fragments and angulation. Electronically Signed   By: Rolm Baptise M.D.   On: 04/12/2019 12:14   Dg Abd 1 View  Result Date: 04/11/2019 CLINICAL DATA:  Constipation EXAM: ABDOMEN - 1 VIEW COMPARISON:  None. FINDINGS: Lung bases are clear. Dextroscoliosis of the spine. Nonobstructed gas pattern with mild stool. Right hip replacement. IMPRESSION: Nonobstructed gas pattern with mild stool Electronically Signed   By: Donavan Foil M.D.   On: 04/11/2019 23:50   Pelvis Portable  Result Date: 04/12/2019 CLINICAL DATA:  Left hip arthroplasty EXAM: PORTABLE PELVIS 1-2 VIEWS COMPARISON:  Radiograph 04/12/2019 FINDINGS: Patient is post left hip hemiarthroplasty. Expected alignment of the articulating femoral component. Right hip hemiarthroplasty is noted as well. Mild bilateral hip osteoarthrosis is present with  sclerotic changes of the acetabuli. Postsurgical soft tissue changes are noted over the left hip with soft tissue gas, swelling and  left hip intra-articular gas as well. Remaining soft tissues are unremarkable. IMPRESSION: 1. Expected postoperative changes of recent left hip hemiarthroplasty without evidence of hardware complication. 2. Mild bilateral hip osteoarthrosis, similar to prior study. Electronically Signed   By: Lovena Le M.D.   On: 04/12/2019 17:31   Dg Pelvis Portable  Result Date: 04/12/2019 CLINICAL DATA:  Fall.  Left hip fracture. EXAM: PORTABLE PELVIS 1-2 VIEWS COMPARISON:  Abdomen 04/11/2019. FINDINGS: Angulated fracture of the left femoral neck is noted. Degenerative change lumbar spine and left hip. Total right hip replacement. Pelvic calcifications consistent phleboliths. IMPRESSION: Angulated fracture of the left femoral neck. Electronically Signed   By: Marcello Moores  Register   On: 04/12/2019 07:55   Chest Portable 1 View  Result Date: 04/11/2019 CLINICAL DATA:  Preop hip fracture EXAM: PORTABLE CHEST 1 VIEW COMPARISON:  11/09/2013 FINDINGS: Stable scarring at the bilateral lung bases. No acute consolidation or pleural effusion. Stable cardiomediastinal silhouette. Aortic atherosclerosis. No pneumothorax. IMPRESSION: No active disease.  Stable scarring at the bilateral lung bases Electronically Signed   By: Donavan Foil M.D.   On: 04/11/2019 23:51     Antimicrobials:   Perioperative per Ortho   Subjective: Did well overnight. Complaints today  Objective: Vitals:   04/17/19 1447 04/17/19 2118 04/18/19 0504 04/18/19 1314  BP: 113/62 126/62 123/61 126/61  Pulse: 74 75 74 69  Resp: 16 16 18 15   Temp: 98.4 F (36.9 C) 98.2 F (36.8 C) 97.9 F (36.6 C) 98.9 F (37.2 C)  TempSrc: Oral   Oral  SpO2: 91% 95% 96% 94%  Weight:      Height:        Intake/Output Summary (Last 24 hours) at 04/18/2019 1603 Last data filed at 04/18/2019 1315 Gross per 24 hour  Intake 240 ml  Output 1850 ml  Net -1610 ml   Filed Weights   04/11/19 2235 04/12/19 1353 04/13/19 1613  Weight: 51 kg 51 kg 51 kg     Examination:  General exam: Appears calm and comfortable  Respiratory system: Clear to auscultation. Respiratory effort normal. Cardiovascular system: S1 & S2 heard, RRR. No JVD, murmurs, rubs, gallops or clicks. No pedal edema. Gastrointestinal system: Abdomen is nondistended, soft and nontender. No organomegaly or masses felt. Normal bowel sounds heard. Central nervous system: Alert and oriented. No focal neurological deficits. Extremities: Warm well perfused, neurovascularly intact Skin: No rashes, lesions or ulcers Psychiatry: Judgement and insight appear normal. Mood & affect appropriate.     Data Reviewed: I have personally reviewed following labs and imaging studies  CBC: Recent Labs  Lab 04/12/19 0327 04/13/19 0305 04/14/19 0307 04/15/19 0636 04/17/19 0301  WBC 9.1 9.3 7.3 6.6 6.2  NEUTROABS  --  7.3  --   --  3.2  HGB 12.5 11.1* 10.3* 9.7* 9.5*  HCT 41.5 36.7 33.9* 31.3* 30.2*  MCV 99.5 101.7* 99.7 98.4 96.5  PLT 132* 107* 89* 108* [id number]   Basic Metabolic Panel: Recent Labs  Lab 04/12/19 0327 04/13/19 0305 04/14/19 0307 04/15/19 0259  NA 139 135 135 135  K 4.3 4.9 3.9 4.2  CL 104 100 104 102  CO2 28 26 22 25   GLUCOSE 106* 107* 93 108*  BUN 11 11 9 16   CREATININE 1.06* 1.05* 0.89 1.03*  CALCIUM 8.6* 8.3* 8.1* 8.3*   GFR: Estimated Creatinine Clearance: 31.6 mL/min (A) (by C-G  formula based on SCr of 1.03 mg/dL (H)). Liver Function Tests: Recent Labs  Lab 04/12/19 0327  ALBUMIN 3.7   No results for input(s): LIPASE, AMYLASE in the last 168 hours. No results for input(s): AMMONIA in the last 168 hours. Coagulation Profile: Recent Labs  Lab 04/12/19 1330  INR 0.9   Cardiac Enzymes: No results for input(s): CKTOTAL, CKMB, CKMBINDEX, TROPONINI in the last 168 hours. BNP (last 3 results) No results for input(s): PROBNP in the last 8760 hours. HbA1C: No results for input(s): HGBA1C in the last 72 hours. CBG: No results for input(s): GLUCAP in the  last 168 hours. Lipid Profile: No results for input(s): CHOL, HDL, LDLCALC, TRIG, CHOLHDL, LDLDIRECT in the last 72 hours. Thyroid Function Tests: No results for input(s): TSH, T4TOTAL, FREET4, T3FREE, THYROIDAB in the last 72 hours. Anemia Panel: No results for input(s): VITAMINB12, FOLATE, FERRITIN, TIBC, IRON, RETICCTPCT in the last 72 hours. Sepsis Labs: No results for input(s): PROCALCITON, LATICACIDVEN in the last 168 hours.  Recent Results (from the past 240 hour(s))  Surgical PCR screen     Status: None   Collection Time: 04/12/19 12:46 AM   Specimen: Nasal Mucosa; Nasal Swab  Result Value Ref Range Status   MRSA, PCR NEGATIVE NEGATIVE Final   Staphylococcus aureus NEGATIVE NEGATIVE Final    Comment: (NOTE) The Xpert SA Assay (FDA approved for NASAL specimens in patients 11 years of age and older), is one component of a comprehensive surveillance program. It is not intended to diagnose infection nor to guide or monitor treatment. Performed at [facility], [place] [address] [place], [place] [address]   SARS CORONAVIRUS 2 (TAT 6-24 HRS) Nasopharyngeal Nasopharyngeal Swab     Status: None   Collection Time: 04/12/19 12:48 AM   Specimen: Nasopharyngeal Swab  Result Value Ref Range Status   SARS Coronavirus 2 NEGATIVE NEGATIVE Final    Comment: (NOTE) SARS-CoV-2 target nucleic acids are NOT DETECTED. The SARS-CoV-2 RNA is generally detectable in upper and lower respiratory specimens during the acute phase of infection. Negative results do not preclude SARS-CoV-2 infection, do not rule out co-infections with other pathogens, and should not be used as the sole basis for treatment or other patient management decisions. Negative results must be combined with clinical observations, patient history, and epidemiological information. The expected result is Negative. Fact Sheet for Patients: SugarRoll.be Fact Sheet for Healthcare  Providers: https://www.woods-mathews.com/ This test is not yet approved or cleared by the [place] FDA and  has been authorized for detection and/or diagnosis of SARS-CoV-2 by FDA under an Emergency Use Authorization (EUA). This EUA will remain  in effect (meaning this test can be used) for the duration of the COVID-19 declaration under Section 56 4(b)(1) of the Act, 21 U.S.C. section 360bbb-3(b)(1), unless the authorization is terminated or revoked sooner. Performed at [facility], [place] [address] [place], [place] [address]   SARS CORONAVIRUS 2 (TAT 6-24 HRS) Nasopharyngeal Nasopharyngeal Swab     Status: None   Collection Time: 04/17/19  1:22 PM   Specimen: Nasopharyngeal Swab  Result Value Ref Range Status   SARS Coronavirus 2 NEGATIVE NEGATIVE Final    Comment: (NOTE) SARS-CoV-2 target nucleic acids are NOT DETECTED. The SARS-CoV-2 RNA is generally detectable in upper and lower respiratory specimens during the acute phase of infection. Negative results do not preclude SARS-CoV-2 infection, do not rule out co-infections with other pathogens, and should not be used as the sole basis for treatment or other  patient management decisions. Negative results must be combined with clinical observations, patient history, and epidemiological information. The expected result is Negative. Fact Sheet for Patients: SugarRoll.be Fact Sheet for Healthcare Providers: https://www.woods-mathews.com/ This test is not yet approved or cleared by the [place] FDA and  has been authorized for detection and/or diagnosis of SARS-CoV-2 by FDA under an Emergency Use Authorization (EUA). This EUA will remain  in effect (meaning this test can be used) for the duration of the COVID-19 declaration under Section 56 4(b)(1) of the Act, 21 U.S.C. section 360bbb-3(b)(1), unless the authorization is terminated or revoked sooner. Performed at  [facility], [place] [address] [place], [place] [address]          Radiology Studies: No results found.      Scheduled Meds: . aspirin  81 mg Oral Daily  . atorvastatin  80 mg Oral QHS  . clonazePAM  1 mg Oral TID  . docusate sodium  100 mg Oral BID  . ferrous sulfate  325 mg Oral TID PC  . levothyroxine  75 mcg Oral Q0600  . nicotine  14 mg Transdermal Daily  . polyethylene glycol  17 g Oral Daily  . sertraline  50 mg Oral BID  . Vitamin D (Ergocalciferol)  50,000 Units Oral Q7 days   Continuous Infusions: . methocarbamol (ROBAXIN) IV       LOS: 7 days    Time spent: 25 MIN    Nicolette Bang, MD [facility]  If 7PM-7AM, please contact night-coverage  04/18/2019, 4:03 PM

## 2019-04-19 DIAGNOSIS — J449 Chronic obstructive pulmonary disease, unspecified: Secondary | ICD-10-CM | POA: Diagnosis not present

## 2019-04-19 DIAGNOSIS — M6281 Muscle weakness (generalized): Secondary | ICD-10-CM | POA: Diagnosis not present

## 2019-04-19 DIAGNOSIS — M25532 Pain in left wrist: Secondary | ICD-10-CM | POA: Diagnosis not present

## 2019-04-19 DIAGNOSIS — Z9181 History of falling: Secondary | ICD-10-CM | POA: Diagnosis not present

## 2019-04-19 DIAGNOSIS — M2550 Pain in unspecified joint: Secondary | ICD-10-CM | POA: Diagnosis not present

## 2019-04-19 DIAGNOSIS — Z96642 Presence of left artificial hip joint: Secondary | ICD-10-CM | POA: Diagnosis not present

## 2019-04-19 DIAGNOSIS — S52572D Other intraarticular fracture of lower end of left radius, subsequent encounter for closed fracture with routine healing: Secondary | ICD-10-CM | POA: Diagnosis not present

## 2019-04-19 DIAGNOSIS — E039 Hypothyroidism, unspecified: Secondary | ICD-10-CM | POA: Diagnosis not present

## 2019-04-19 DIAGNOSIS — I10 Essential (primary) hypertension: Secondary | ICD-10-CM | POA: Diagnosis not present

## 2019-04-19 DIAGNOSIS — S72002D Fracture of unspecified part of neck of left femur, subsequent encounter for closed fracture with routine healing: Secondary | ICD-10-CM | POA: Diagnosis not present

## 2019-04-19 DIAGNOSIS — S72002A Fracture of unspecified part of neck of left femur, initial encounter for closed fracture: Secondary | ICD-10-CM | POA: Diagnosis not present

## 2019-04-19 DIAGNOSIS — I469 Cardiac arrest, cause unspecified: Secondary | ICD-10-CM | POA: Diagnosis not present

## 2019-04-19 DIAGNOSIS — F339 Major depressive disorder, recurrent, unspecified: Secondary | ICD-10-CM | POA: Diagnosis not present

## 2019-04-19 DIAGNOSIS — F419 Anxiety disorder, unspecified: Secondary | ICD-10-CM | POA: Diagnosis not present

## 2019-04-19 DIAGNOSIS — S52539D Colles' fracture of unspecified radius, subsequent encounter for closed fracture with routine healing: Secondary | ICD-10-CM | POA: Diagnosis not present

## 2019-04-19 DIAGNOSIS — Z4789 Encounter for other orthopedic aftercare: Secondary | ICD-10-CM | POA: Diagnosis not present

## 2019-04-19 DIAGNOSIS — Z7401 Bed confinement status: Secondary | ICD-10-CM | POA: Diagnosis not present

## 2019-04-19 DIAGNOSIS — S7292XD Unspecified fracture of left femur, subsequent encounter for closed fracture with routine healing: Secondary | ICD-10-CM | POA: Diagnosis not present

## 2019-04-19 DIAGNOSIS — Z471 Aftercare following joint replacement surgery: Secondary | ICD-10-CM | POA: Diagnosis not present

## 2019-04-19 DIAGNOSIS — R2689 Other abnormalities of gait and mobility: Secondary | ICD-10-CM | POA: Diagnosis not present

## 2019-04-19 DIAGNOSIS — R41841 Cognitive communication deficit: Secondary | ICD-10-CM | POA: Diagnosis not present

## 2019-04-19 MED ORDER — CLONAZEPAM 1 MG PO TABS: 1.0000 mg | ORAL_TABLET | Freq: Three times a day (TID) | ORAL | 0 refills | Status: AC

## 2019-04-19 MED ORDER — HYDROCODONE-ACETAMINOPHEN 5-325 MG PO TABS: 1.0000 | ORAL_TABLET | Freq: Four times a day (QID) | ORAL | 0 refills | Status: AC | PRN

## 2019-04-19 NOTE — TOC Progression Note (Addendum)
Transition of Care [facility]) - Progression Note    Patient Details  Name: Kara Mcdowell MRN: [id number] Date of Birth: [birth year]  [facility] CM/SW Contact  Leeroy Cha, RN Phone Number: 04/19/2019, 12:30 PM  Clinical Narrative:    tct patricia in admission at [facility]-message to return call PER PATRICIA AT [facility]. ptar called at [phone] Packet and information given to floor rn    Barriers to Discharge: Other (comment)(passar number)  Expected Discharge Plan and Services           Expected Discharge Date: 04/19/19                                     Social Determinants of Health (SDOH) Interventions    Readmission Risk Interventions No flowsheet data found.

## 2019-04-19 NOTE — Progress Notes (Signed)
Attempted to call report. Message left for Kara Mcdowell.

## 2019-04-19 NOTE — TOC Transition Note (Signed)
Transition of Care [facility]) - CM/SW Discharge Note   Patient Details  Name: Kara Mcdowell MRN: [id number] Date of Birth: 07-01-1929  Transition of Care [facility]) CM/SW Contact:  Leeroy Cha, RN Phone Number: 04/19/2019, 10:07 AM   Clinical Narrative:    passar number rec'd=[phone] A  fl2 updatred and sent to [facility]    Barriers to Discharge: Other (comment)(passar number)   Patient Goals and CMS Choice        Discharge Placement                       Discharge Plan and Services                                     Social Determinants of Health (SDOH) Interventions     Readmission Risk Interventions No flowsheet data found.

## 2019-04-19 NOTE — Discharge Summary (Signed)
Physician Discharge Summary  Kara Mcdowell [id number] DOB: 1929/07/01 DOA: 04/11/2019  PCP: Betsey Holiday, MD  Admit date: 04/11/2019 Discharge date: 04/19/2019  Admitted From: Inpatient Disposition: SNF  Recommendations for Outpatient Follow-up:  1. Follow up with PCP in 1-2 weeks 2. Follow-up with orthopedics per Ortho:  Home Health:No Equipment/Devices: Per physical therapy  Discharge Condition:Stable CODE STATUS:Full code Diet recommendation: Regular healthy diet  Brief/Interim Summary: Patient is 85 year old female with history of hypertension, hyperlipidemia, hypothyroidism who presented with a fall from her home after stepping off of a stool. She fell on her left side. She was brought to the emergency department at [facility] where she was found to have left femoral neck fracture and left radiusfracture. She was transferred to [facility] for surgery. Orthopedics following and she underwent ORIF for left hip fracture and left radius fracture. She is medically stable for discharge.  Will be discharged today.  Hospital course as follows:  Hospital course: Closed left hip fracture: Orthopedicswasfollowing. Underwent left hip hemiarthoplasty . Pain well controlled. Continue pain management Patient ambulates with cane at home. She lives by herself. PT/OT recommended skilled nursing facility. Started on aspirin for DVT prophylaxis whichwill be continued for 4 weeks.. She needs to follow-up with orthopedics in 2 weeks after discharge.  Negative Covid.  Stable for discharge today  Left radius fracture:Xray Distal left radial fracture with continued mild displaced fracture fragments and angulation.S/P ORIF, follow-up as above  Vitamin D deficiency: Follow-up with primary care physician,   Hypertension: BP currently remains stable, continue home meds  Hypothyroidism: Continue Synthyroid no evidence of hormonal dysregulation  Hyperlipidemia: On Lipitor at  home, continue on discharge  Anxiety/depression:On clonazepam, sertraline at home.  We will continue.  Discharge Diagnoses:  Active Problems:   Essential hypertension, benign   Hypothyroidism   Closed left hip fracture [facility])    Discharge Instructions  Discharge Instructions    Call MD for:   Complete by: As directed    Any acute change in medical condition   Call MD for:  difficulty breathing, headache or visual disturbances   Complete by: As directed    Call MD for:  extreme fatigue   Complete by: As directed    Call MD for:  hives   Complete by: As directed    Call MD for:  persistant dizziness or light-headedness   Complete by: As directed    Call MD for:  persistant nausea and vomiting   Complete by: As directed    Call MD for:  redness, tenderness, or signs of infection (pain, swelling, redness, odor or green/yellow discharge around incision site)   Complete by: As directed    Call MD for:  severe uncontrolled pain   Complete by: As directed    Call MD for:  temperature >100.4   Complete by: As directed    Diet - low sodium heart healthy   Complete by: As directed    Discharge instructions   Complete by: As directed    Weightbearing status per orthopedics   Increase activity slowly   Complete by: As directed    Non weight bearing   Complete by: As directed    NWB in the left wrist, okay to weight-bear through a platform walker in the left upper extremity   Laterality: left   Extremity: Lower   Weight bearing as tolerated   Complete by: As directed    Laterality: left   Extremity: Lower     Allergies as of 04/19/2019  No Known Allergies     Medication List    STOP taking these medications   feeding supplement (ENSURE COMPLETE) Liqd   folic acid 1 MG tablet Commonly known as: FOLVITE   warfarin 5 MG tablet Commonly known as: Coumadin     TAKE these medications   aspirin 81 MG chewable tablet Commonly known as: Aspirin Childrens Chew 1  tablet (81 mg total) by mouth 2 (two) times daily. Take for 4 weeks, then resume regular dose.   atorvastatin 80 MG tablet Commonly known as: LIPITOR Take 80 mg by mouth at bedtime.   clonazePAM 1 MG tablet Commonly known as: KLONOPIN Take 1 tablet (1 mg total) by mouth 3 (three) times daily.   docusate sodium 100 MG capsule Commonly known as: Colace Take 1 capsule (100 mg total) by mouth 2 (two) times daily.   ferrous sulfate 325 (65 FE) MG tablet Commonly known as: FerrouSul Take 1 tablet (325 mg total) by mouth 3 (three) times daily with meals for 14 days.   HYDROcodone-acetaminophen 5-325 MG tablet Commonly known as: Norco Take 1-2 tablets by mouth every 6 (six) hours as needed for moderate pain or severe pain.   levothyroxine 75 MCG tablet Commonly known as: SYNTHROID Take 75 mcg by mouth daily before breakfast.   lisinopril 30 MG tablet Commonly known as: ZESTRIL Take 30 mg by mouth daily.   methocarbamol 500 MG tablet Commonly known as: Robaxin Take 1 tablet (500 mg total) by mouth every 6 (six) hours as needed for muscle spasms.   polyethylene glycol 17 g packet Commonly known as: MIRALAX / GLYCOLAX Take 17 g by mouth 2 (two) times daily.   sertraline 100 MG tablet Commonly known as: ZOLOFT Take 50 mg by mouth 2 (two) times daily.            Discharge Care Instructions  (From admission, onward)         Start     Ordered   04/14/19 0000  Weight bearing as tolerated    Question Answer Comment  Laterality left   Extremity Lower      04/14/19 1118   04/14/19 0000  Non weight bearing    Comments: NWB in the left wrist, okay to weight-bear through a platform walker in the left upper extremity  Question Answer Comment  Laterality left   Extremity Lower      04/14/19 1118          Contact information for follow-up providers    Paralee Cancel, MD. Schedule an appointment as soon as possible for a visit in 2 weeks.   Specialty: Orthopedic  Surgery Contact information: [address] [place] [address] [id number]            Contact information for after-discharge care    [facility] .   Service: Skilled Nursing Contact information: [address] [place] [phone]                 No Known Allergies  Consultations:  ortho   Procedures/Studies: Dg Wrist 2 Views Left  Result Date: 04/12/2019 CLINICAL DATA:  Wrist fracture, follow-up EXAM: LEFT WRIST - 2 VIEW COMPARISON:  04/11/2019 FINDINGS: In cast views of the left wrist again demonstrate the comminuted intra-articular displaced distal left radial fracture. Continued mild displacement of fracture fragments and slight posterior angulation noted on the lateral view. IMPRESSION: Distal left radial fracture  with continued mild displaced fracture fragments and angulation. Electronically Signed   By: Rolm Baptise M.D.   On: 04/12/2019 12:14   Dg Abd 1 View  Result Date: 04/11/2019 CLINICAL DATA:  Constipation EXAM: ABDOMEN - 1 VIEW COMPARISON:  None. FINDINGS: Lung bases are clear. Dextroscoliosis of the spine. Nonobstructed gas pattern with mild stool. Right hip replacement. IMPRESSION: Nonobstructed gas pattern with mild stool Electronically Signed   By: Donavan Foil M.D.   On: 04/11/2019 23:50   Pelvis Portable  Result Date: 04/12/2019 CLINICAL DATA:  Left hip arthroplasty EXAM: PORTABLE PELVIS 1-2 VIEWS COMPARISON:  Radiograph 04/12/2019 FINDINGS: Patient is post left hip hemiarthroplasty. Expected alignment of the articulating femoral component. Right hip hemiarthroplasty is noted as well. Mild bilateral hip osteoarthrosis is present with sclerotic changes of the acetabuli. Postsurgical soft tissue changes are noted over the left hip with soft tissue gas, swelling and left hip intra-articular gas as well. Remaining soft tissues are  unremarkable. IMPRESSION: 1. Expected postoperative changes of recent left hip hemiarthroplasty without evidence of hardware complication. 2. Mild bilateral hip osteoarthrosis, similar to prior study. Electronically Signed   By: Lovena Le M.D.   On: 04/12/2019 17:31   Dg Pelvis Portable  Result Date: 04/12/2019 CLINICAL DATA:  Fall.  Left hip fracture. EXAM: PORTABLE PELVIS 1-2 VIEWS COMPARISON:  Abdomen 04/11/2019. FINDINGS: Angulated fracture of the left femoral neck is noted. Degenerative change lumbar spine and left hip. Total right hip replacement. Pelvic calcifications consistent phleboliths. IMPRESSION: Angulated fracture of the left femoral neck. Electronically Signed   By: Marcello Moores  Register   On: 04/12/2019 07:55   Chest Portable 1 View  Result Date: 04/11/2019 CLINICAL DATA:  Preop hip fracture EXAM: PORTABLE CHEST 1 VIEW COMPARISON:  11/09/2013 FINDINGS: Stable scarring at the bilateral lung bases. No acute consolidation or pleural effusion. Stable cardiomediastinal silhouette. Aortic atherosclerosis. No pneumothorax. IMPRESSION: No active disease.  Stable scarring at the bilateral lung bases Electronically Signed   By: Donavan Foil M.D.   On: 04/11/2019 23:51       Subjective: Resting in bed comfortably no acute events Stable for discharge to skilled nursing facility today  Discharge Exam: Vitals:   04/19/19 0444 04/19/19 0521  BP: 133/65   Pulse: 73   Resp: 16   Temp: 98.2 F (36.8 C)   SpO2: (!) 88% 94%   Vitals:   04/18/19 1314 04/18/19 2303 04/19/19 0444 04/19/19 0521  BP: 126/61 139/67 133/65   Pulse: 69 69 73   Resp: 15 17 16    Temp: 98.9 F (37.2 C) 98.3 F (36.8 C) 98.2 F (36.8 C)   TempSrc: Oral Oral    SpO2: 94% 95% (!) 88% 94%  Weight:      Height:        General: Pt is alert, awake, not in acute distress Cardiovascular: RRR, S1/S2 +, no rubs, no gallops Respiratory: CTA bilaterally, no wheezing, no rhonchi Abdominal: Soft, NT, ND, bowel  sounds + Extremities: no edema, no cyanosis left upper extremity in cast secondary to radial fracture, neurovascularly intact, left lower extremity limited range of motion secondary to pain but neurological neurovascular intact    The results of significant diagnostics from this hospitalization (including imaging, microbiology, ancillary and laboratory) are listed below for reference.     Microbiology: Recent Results (from the past 240 hour(s))  Surgical PCR screen     Status: None   Collection Time: 04/12/19 12:46 AM   Specimen: Nasal Mucosa; Nasal Swab  Result  Value Ref Range Status   MRSA, PCR NEGATIVE NEGATIVE Final   Staphylococcus aureus NEGATIVE NEGATIVE Final    Comment: (NOTE) The Xpert SA Assay (FDA approved for NASAL specimens in patients 18 years of age and older), is one component of a comprehensive surveillance program. It is not intended to diagnose infection nor to guide or monitor treatment. Performed at [facility], [place] [address], [place], [place] [address]   SARS CORONAVIRUS 2 (TAT 6-24 HRS) Nasopharyngeal Nasopharyngeal Swab     Status: None   Collection Time: 04/12/19 12:48 AM   Specimen: Nasopharyngeal Swab  Result Value Ref Range Status   SARS Coronavirus 2 NEGATIVE NEGATIVE Final    Comment: (NOTE) SARS-CoV-2 target nucleic acids are NOT DETECTED. The SARS-CoV-2 RNA is generally detectable in upper and lower respiratory specimens during the acute phase of infection. Negative results do not preclude SARS-CoV-2 infection, do not rule out co-infections with other pathogens, and should not be used as the sole basis for treatment or other patient management decisions. Negative results must be combined with clinical observations, patient history, and epidemiological information. The expected result is Negative. Fact Sheet for Patients: SugarRoll.be Fact Sheet for Healthcare  Providers: https://www.woods-mathews.com/ This test is not yet approved or cleared by the [place] FDA and  has been authorized for detection and/or diagnosis of SARS-CoV-2 by FDA under an Emergency Use Authorization (EUA). This EUA will remain  in effect (meaning this test can be used) for the duration of the COVID-19 declaration under Section 56 4(b)(1) of the Act, 21 U.S.C. section 360bbb-3(b)(1), unless the authorization is terminated or revoked sooner. Performed at [facility], [place] [address], [place], [place] [address]   SARS CORONAVIRUS 2 (TAT 6-24 HRS) Nasopharyngeal Nasopharyngeal Swab     Status: None   Collection Time: 04/17/19  1:22 PM   Specimen: Nasopharyngeal Swab  Result Value Ref Range Status   SARS Coronavirus 2 NEGATIVE NEGATIVE Final    Comment: (NOTE) SARS-CoV-2 target nucleic acids are NOT DETECTED. The SARS-CoV-2 RNA is generally detectable in upper and lower respiratory specimens during the acute phase of infection. Negative results do not preclude SARS-CoV-2 infection, do not rule out co-infections with other pathogens, and should not be used as the sole basis for treatment or other patient management decisions. Negative results must be combined with clinical observations, patient history, and epidemiological information. The expected result is Negative. Fact Sheet for Patients: SugarRoll.be Fact Sheet for Healthcare Providers: https://www.woods-mathews.com/ This test is not yet approved or cleared by the [place] FDA and  has been authorized for detection and/or diagnosis of SARS-CoV-2 by FDA under an Emergency Use Authorization (EUA). This EUA will remain  in effect (meaning this test can be used) for the duration of the COVID-19 declaration under Section 56 4(b)(1) of the Act, 21 U.S.C. section 360bbb-3(b)(1), unless the authorization is terminated or revoked sooner. Performed at  [facility], [place] [address], [place], [place] [address]      Labs: BNP (last 3 results) No results for input(s): BNP in the last 8760 hours. Basic Metabolic Panel: Recent Labs  Lab 04/13/19 0305 04/14/19 0307 04/15/19 0259  NA 135 135 135  K 4.9 3.9 4.2  CL 100 104 102  CO2 26 22 25   GLUCOSE 107* 93 108*  BUN 11 9 16   CREATININE 1.05* 0.89 1.03*  CALCIUM 8.3* 8.1* 8.3*   Liver Function Tests: No results for input(s): AST, ALT, ALKPHOS, BILITOT, PROT, ALBUMIN in  the last 168 hours. No results for input(s): LIPASE, AMYLASE in the last 168 hours. No results for input(s): AMMONIA in the last 168 hours. CBC: Recent Labs  Lab 04/13/19 0305 04/14/19 0307 04/15/19 0636 04/17/19 0301  WBC 9.3 7.3 6.6 6.2  NEUTROABS 7.3  --   --  3.2  HGB 11.1* 10.3* 9.7* 9.5*  HCT 36.7 33.9* 31.3* 30.2*  MCV 101.7* 99.7 98.4 96.5  PLT 107* 89* 108* 168   Cardiac Enzymes: No results for input(s): CKTOTAL, CKMB, CKMBINDEX, TROPONINI in the last 168 hours. BNP: Invalid input(s): POCBNP CBG: No results for input(s): GLUCAP in the last 168 hours. D-Dimer No results for input(s): DDIMER in the last 72 hours. Hgb A1c No results for input(s): HGBA1C in the last 72 hours. Lipid Profile No results for input(s): CHOL, HDL, LDLCALC, TRIG, CHOLHDL, LDLDIRECT in the last 72 hours. Thyroid function studies No results for input(s): TSH, T4TOTAL, T3FREE, THYROIDAB in the last 72 hours.  Invalid input(s): FREET3 Anemia work up No results for input(s): VITAMINB12, FOLATE, FERRITIN, TIBC, IRON, RETICCTPCT in the last 72 hours. Urinalysis    Component Value Date/Time   COLORURINE YELLOW 11/08/2013 1450   APPEARANCEUR HAZY (A) 11/08/2013 1450   LABSPEC 1.019 11/08/2013 1450   PHURINE 5.0 11/08/2013 1450   GLUCOSEU NEGATIVE 11/08/2013 1450   HGBUR MODERATE (A) 11/08/2013 1450   BILIRUBINUR NEGATIVE 11/08/2013 1450   KETONESUR NEGATIVE 11/08/2013 1450   PROTEINUR 30 (A) 11/08/2013 1450    UROBILINOGEN 0.2 11/08/2013 1450   NITRITE NEGATIVE 11/08/2013 1450   LEUKOCYTESUR NEGATIVE 11/08/2013 1450   Sepsis Labs Invalid input(s): PROCALCITONIN,  WBC,  LACTICIDVEN Microbiology Recent Results (from the past 240 hour(s))  Surgical PCR screen     Status: None   Collection Time: 04/12/19 12:46 AM   Specimen: Nasal Mucosa; Nasal Swab  Result Value Ref Range Status   MRSA, PCR NEGATIVE NEGATIVE Final   Staphylococcus aureus NEGATIVE NEGATIVE Final    Comment: (NOTE) The Xpert SA Assay (FDA approved for NASAL specimens in patients 75 years of age and older), is one component of a comprehensive surveillance program. It is not intended to diagnose infection nor to guide or monitor treatment. Performed at [facility], [place] [address], [place], [place] [address]   SARS CORONAVIRUS 2 (TAT 6-24 HRS) Nasopharyngeal Nasopharyngeal Swab     Status: None   Collection Time: 04/12/19 12:48 AM   Specimen: Nasopharyngeal Swab  Result Value Ref Range Status   SARS Coronavirus 2 NEGATIVE NEGATIVE Final    Comment: (NOTE) SARS-CoV-2 target nucleic acids are NOT DETECTED. The SARS-CoV-2 RNA is generally detectable in upper and lower respiratory specimens during the acute phase of infection. Negative results do not preclude SARS-CoV-2 infection, do not rule out co-infections with other pathogens, and should not be used as the sole basis for treatment or other patient management decisions. Negative results must be combined with clinical observations, patient history, and epidemiological information. The expected result is Negative. Fact Sheet for Patients: SugarRoll.be Fact Sheet for Healthcare Providers: https://www.woods-mathews.com/ This test is not yet approved or cleared by the [place] FDA and  has been authorized for detection and/or diagnosis of SARS-CoV-2 by FDA under an Emergency Use Authorization (EUA). This EUA  will remain  in effect (meaning this test can be used) for the duration of the COVID-19 declaration under Section 56 4(b)(1) of the Act, 21 U.S.C. section 360bbb-3(b)(1), unless the authorization is terminated or revoked sooner. Performed at [facility], [address] [place], [place] [address]   SARS CORONAVIRUS 2 (TAT 6-24 HRS) Nasopharyngeal Nasopharyngeal Swab     Status: None   Collection Time: 04/17/19  1:22 PM   Specimen: Nasopharyngeal Swab  Result Value Ref Range Status   SARS Coronavirus 2 NEGATIVE NEGATIVE Final    Comment: (NOTE) SARS-CoV-2 target nucleic acids are NOT DETECTED. The SARS-CoV-2 RNA is generally detectable in upper and lower respiratory specimens during the acute phase of infection. Negative results do not preclude SARS-CoV-2 infection, do not rule out co-infections with other pathogens, and should not be used as the sole basis for treatment or other patient management decisions. Negative results must be combined with clinical observations, patient history, and epidemiological information. The expected result is Negative. Fact Sheet for Patients: SugarRoll.be Fact Sheet for Healthcare Providers: https://www.woods-mathews.com/ This test is not yet approved or cleared by the [place] FDA and  has been authorized for detection and/or diagnosis of SARS-CoV-2 by FDA under an Emergency Use Authorization (EUA). This EUA will remain  in effect (meaning this test can be used) for the duration of the COVID-19 declaration under Section 56 4(b)(1) of the Act, 21 U.S.C. section 360bbb-3(b)(1), unless the authorization is terminated or revoked sooner. Performed at [facility], [place] [address] [place], [place] [address]      Time coordinating discharge: Over 30 minutes  SIGNED:   Nicolette Bang, MD  [facility] 04/19/2019, 11:26 AM Pager   If 7PM-7AM, please contact  night-coverage [email or web address] Password [email or web address]

## 2019-05-04 DIAGNOSIS — M25532 Pain in left wrist: Secondary | ICD-10-CM | POA: Diagnosis not present

## 2019-05-04 DIAGNOSIS — S7292XD Unspecified fracture of left femur, subsequent encounter for closed fracture with routine healing: Secondary | ICD-10-CM | POA: Diagnosis not present

## 2019-05-04 DIAGNOSIS — Z471 Aftercare following joint replacement surgery: Secondary | ICD-10-CM | POA: Diagnosis not present

## 2019-05-04 DIAGNOSIS — Z96642 Presence of left artificial hip joint: Secondary | ICD-10-CM | POA: Diagnosis not present

## 2019-05-04 DIAGNOSIS — S52539D Colles' fracture of unspecified radius, subsequent encounter for closed fracture with routine healing: Secondary | ICD-10-CM | POA: Diagnosis not present

## 2019-05-05 DIAGNOSIS — F419 Anxiety disorder, unspecified: Secondary | ICD-10-CM | POA: Diagnosis not present

## 2019-05-06 DIAGNOSIS — S52572D Other intraarticular fracture of lower end of left radius, subsequent encounter for closed fracture with routine healing: Secondary | ICD-10-CM | POA: Diagnosis not present

## 2019-05-06 DIAGNOSIS — Z4789 Encounter for other orthopedic aftercare: Secondary | ICD-10-CM | POA: Diagnosis not present

## 2019-05-27 DIAGNOSIS — F17210 Nicotine dependence, cigarettes, uncomplicated: Secondary | ICD-10-CM | POA: Diagnosis not present

## 2019-05-27 DIAGNOSIS — E785 Hyperlipidemia, unspecified: Secondary | ICD-10-CM | POA: Diagnosis not present

## 2019-05-27 DIAGNOSIS — F419 Anxiety disorder, unspecified: Secondary | ICD-10-CM | POA: Diagnosis not present

## 2019-05-27 DIAGNOSIS — S52122D Displaced fracture of head of left radius, subsequent encounter for closed fracture with routine healing: Secondary | ICD-10-CM | POA: Diagnosis not present

## 2019-05-27 DIAGNOSIS — M1990 Unspecified osteoarthritis, unspecified site: Secondary | ICD-10-CM | POA: Diagnosis not present

## 2019-05-27 DIAGNOSIS — I10 Essential (primary) hypertension: Secondary | ICD-10-CM | POA: Diagnosis not present

## 2019-05-27 DIAGNOSIS — S7292XD Unspecified fracture of left femur, subsequent encounter for closed fracture with routine healing: Secondary | ICD-10-CM | POA: Diagnosis not present

## 2019-05-27 DIAGNOSIS — J449 Chronic obstructive pulmonary disease, unspecified: Secondary | ICD-10-CM | POA: Diagnosis not present

## 2019-05-27 DIAGNOSIS — E039 Hypothyroidism, unspecified: Secondary | ICD-10-CM | POA: Diagnosis not present

## 2019-05-27 DIAGNOSIS — Z9181 History of falling: Secondary | ICD-10-CM | POA: Diagnosis not present

## 2019-05-31 DIAGNOSIS — I10 Essential (primary) hypertension: Secondary | ICD-10-CM | POA: Diagnosis not present

## 2019-05-31 DIAGNOSIS — S7292XD Unspecified fracture of left femur, subsequent encounter for closed fracture with routine healing: Secondary | ICD-10-CM | POA: Diagnosis not present

## 2019-05-31 DIAGNOSIS — E785 Hyperlipidemia, unspecified: Secondary | ICD-10-CM | POA: Diagnosis not present

## 2019-05-31 DIAGNOSIS — F419 Anxiety disorder, unspecified: Secondary | ICD-10-CM | POA: Diagnosis not present

## 2019-05-31 DIAGNOSIS — S52122D Displaced fracture of head of left radius, subsequent encounter for closed fracture with routine healing: Secondary | ICD-10-CM | POA: Diagnosis not present

## 2019-05-31 DIAGNOSIS — E039 Hypothyroidism, unspecified: Secondary | ICD-10-CM | POA: Diagnosis not present

## 2019-06-01 DIAGNOSIS — E039 Hypothyroidism, unspecified: Secondary | ICD-10-CM | POA: Diagnosis not present

## 2019-06-01 DIAGNOSIS — F419 Anxiety disorder, unspecified: Secondary | ICD-10-CM | POA: Diagnosis not present

## 2019-06-01 DIAGNOSIS — S7292XD Unspecified fracture of left femur, subsequent encounter for closed fracture with routine healing: Secondary | ICD-10-CM | POA: Diagnosis not present

## 2019-06-01 DIAGNOSIS — S52122D Displaced fracture of head of left radius, subsequent encounter for closed fracture with routine healing: Secondary | ICD-10-CM | POA: Diagnosis not present

## 2019-06-01 DIAGNOSIS — I10 Essential (primary) hypertension: Secondary | ICD-10-CM | POA: Diagnosis not present

## 2019-06-01 DIAGNOSIS — E785 Hyperlipidemia, unspecified: Secondary | ICD-10-CM | POA: Diagnosis not present

## 2019-06-09 DIAGNOSIS — F419 Anxiety disorder, unspecified: Secondary | ICD-10-CM | POA: Diagnosis not present

## 2019-06-09 DIAGNOSIS — E039 Hypothyroidism, unspecified: Secondary | ICD-10-CM | POA: Diagnosis not present

## 2019-06-09 DIAGNOSIS — S7292XD Unspecified fracture of left femur, subsequent encounter for closed fracture with routine healing: Secondary | ICD-10-CM | POA: Diagnosis not present

## 2019-06-09 DIAGNOSIS — S52122D Displaced fracture of head of left radius, subsequent encounter for closed fracture with routine healing: Secondary | ICD-10-CM | POA: Diagnosis not present

## 2019-06-09 DIAGNOSIS — E785 Hyperlipidemia, unspecified: Secondary | ICD-10-CM | POA: Diagnosis not present

## 2019-06-09 DIAGNOSIS — I10 Essential (primary) hypertension: Secondary | ICD-10-CM | POA: Diagnosis not present

## 2019-06-10 DIAGNOSIS — E785 Hyperlipidemia, unspecified: Secondary | ICD-10-CM | POA: Diagnosis not present

## 2019-06-10 DIAGNOSIS — I10 Essential (primary) hypertension: Secondary | ICD-10-CM | POA: Diagnosis not present

## 2019-06-10 DIAGNOSIS — E039 Hypothyroidism, unspecified: Secondary | ICD-10-CM | POA: Diagnosis not present

## 2019-06-10 DIAGNOSIS — S52122D Displaced fracture of head of left radius, subsequent encounter for closed fracture with routine healing: Secondary | ICD-10-CM | POA: Diagnosis not present

## 2019-06-10 DIAGNOSIS — F419 Anxiety disorder, unspecified: Secondary | ICD-10-CM | POA: Diagnosis not present

## 2019-06-10 DIAGNOSIS — S7292XD Unspecified fracture of left femur, subsequent encounter for closed fracture with routine healing: Secondary | ICD-10-CM | POA: Diagnosis not present

## 2019-06-13 DIAGNOSIS — E785 Hyperlipidemia, unspecified: Secondary | ICD-10-CM | POA: Diagnosis not present

## 2019-06-13 DIAGNOSIS — Z79899 Other long term (current) drug therapy: Secondary | ICD-10-CM | POA: Diagnosis not present

## 2019-06-13 DIAGNOSIS — R5383 Other fatigue: Secondary | ICD-10-CM | POA: Diagnosis not present

## 2019-06-13 DIAGNOSIS — E039 Hypothyroidism, unspecified: Secondary | ICD-10-CM | POA: Diagnosis not present

## 2019-06-14 DIAGNOSIS — E785 Hyperlipidemia, unspecified: Secondary | ICD-10-CM | POA: Diagnosis not present

## 2019-06-14 DIAGNOSIS — F419 Anxiety disorder, unspecified: Secondary | ICD-10-CM | POA: Diagnosis not present

## 2019-06-14 DIAGNOSIS — I10 Essential (primary) hypertension: Secondary | ICD-10-CM | POA: Diagnosis not present

## 2019-06-14 DIAGNOSIS — S7292XD Unspecified fracture of left femur, subsequent encounter for closed fracture with routine healing: Secondary | ICD-10-CM | POA: Diagnosis not present

## 2019-06-14 DIAGNOSIS — S52122D Displaced fracture of head of left radius, subsequent encounter for closed fracture with routine healing: Secondary | ICD-10-CM | POA: Diagnosis not present

## 2019-06-14 DIAGNOSIS — E039 Hypothyroidism, unspecified: Secondary | ICD-10-CM | POA: Diagnosis not present

## 2019-06-20 DIAGNOSIS — E039 Hypothyroidism, unspecified: Secondary | ICD-10-CM | POA: Diagnosis not present

## 2019-06-20 DIAGNOSIS — E785 Hyperlipidemia, unspecified: Secondary | ICD-10-CM | POA: Diagnosis not present

## 2019-06-20 DIAGNOSIS — F419 Anxiety disorder, unspecified: Secondary | ICD-10-CM | POA: Diagnosis not present

## 2019-06-20 DIAGNOSIS — I10 Essential (primary) hypertension: Secondary | ICD-10-CM | POA: Diagnosis not present

## 2019-06-22 DIAGNOSIS — S52122D Displaced fracture of head of left radius, subsequent encounter for closed fracture with routine healing: Secondary | ICD-10-CM | POA: Diagnosis not present

## 2019-06-22 DIAGNOSIS — E785 Hyperlipidemia, unspecified: Secondary | ICD-10-CM | POA: Diagnosis not present

## 2019-06-22 DIAGNOSIS — F419 Anxiety disorder, unspecified: Secondary | ICD-10-CM | POA: Diagnosis not present

## 2019-06-22 DIAGNOSIS — S7292XD Unspecified fracture of left femur, subsequent encounter for closed fracture with routine healing: Secondary | ICD-10-CM | POA: Diagnosis not present

## 2019-06-22 DIAGNOSIS — E039 Hypothyroidism, unspecified: Secondary | ICD-10-CM | POA: Diagnosis not present

## 2019-06-22 DIAGNOSIS — I10 Essential (primary) hypertension: Secondary | ICD-10-CM | POA: Diagnosis not present

## 2019-07-14 DIAGNOSIS — E039 Hypothyroidism, unspecified: Secondary | ICD-10-CM | POA: Diagnosis not present

## 2019-07-14 DIAGNOSIS — E785 Hyperlipidemia, unspecified: Secondary | ICD-10-CM | POA: Diagnosis not present

## 2019-07-14 DIAGNOSIS — Z9181 History of falling: Secondary | ICD-10-CM | POA: Diagnosis not present

## 2019-07-14 DIAGNOSIS — S7292XD Unspecified fracture of left femur, subsequent encounter for closed fracture with routine healing: Secondary | ICD-10-CM | POA: Diagnosis not present

## 2019-07-14 DIAGNOSIS — I10 Essential (primary) hypertension: Secondary | ICD-10-CM | POA: Diagnosis not present

## 2019-07-14 DIAGNOSIS — M6281 Muscle weakness (generalized): Secondary | ICD-10-CM | POA: Diagnosis not present

## 2019-07-22 DIAGNOSIS — Z9181 History of falling: Secondary | ICD-10-CM | POA: Diagnosis not present

## 2019-07-22 DIAGNOSIS — M6281 Muscle weakness (generalized): Secondary | ICD-10-CM | POA: Diagnosis not present

## 2019-07-25 DIAGNOSIS — Z9181 History of falling: Secondary | ICD-10-CM | POA: Diagnosis not present

## 2019-07-25 DIAGNOSIS — M6281 Muscle weakness (generalized): Secondary | ICD-10-CM | POA: Diagnosis not present

## 2019-07-28 DIAGNOSIS — M6281 Muscle weakness (generalized): Secondary | ICD-10-CM | POA: Diagnosis not present

## 2019-07-28 DIAGNOSIS — Z9181 History of falling: Secondary | ICD-10-CM | POA: Diagnosis not present

## 2019-08-02 DIAGNOSIS — Z9181 History of falling: Secondary | ICD-10-CM | POA: Diagnosis not present

## 2019-08-02 DIAGNOSIS — M6281 Muscle weakness (generalized): Secondary | ICD-10-CM | POA: Diagnosis not present

## 2019-08-05 DIAGNOSIS — Z9181 History of falling: Secondary | ICD-10-CM | POA: Diagnosis not present

## 2019-08-05 DIAGNOSIS — M6281 Muscle weakness (generalized): Secondary | ICD-10-CM | POA: Diagnosis not present

## 2019-08-16 DIAGNOSIS — F17210 Nicotine dependence, cigarettes, uncomplicated: Secondary | ICD-10-CM | POA: Diagnosis not present

## 2019-08-16 DIAGNOSIS — F419 Anxiety disorder, unspecified: Secondary | ICD-10-CM | POA: Diagnosis not present

## 2019-08-16 DIAGNOSIS — I10 Essential (primary) hypertension: Secondary | ICD-10-CM | POA: Diagnosis not present

## 2019-08-16 DIAGNOSIS — J449 Chronic obstructive pulmonary disease, unspecified: Secondary | ICD-10-CM | POA: Diagnosis not present

## 2019-08-16 DIAGNOSIS — E039 Hypothyroidism, unspecified: Secondary | ICD-10-CM | POA: Diagnosis not present

## 2019-08-16 DIAGNOSIS — E785 Hyperlipidemia, unspecified: Secondary | ICD-10-CM | POA: Diagnosis not present

## 2019-08-16 DIAGNOSIS — Z6820 Body mass index (BMI) 20.0-20.9, adult: Secondary | ICD-10-CM | POA: Diagnosis not present

## 2019-08-16 DIAGNOSIS — Z299 Encounter for prophylactic measures, unspecified: Secondary | ICD-10-CM | POA: Diagnosis not present

## 2019-09-13 DIAGNOSIS — R0600 Dyspnea, unspecified: Secondary | ICD-10-CM | POA: Diagnosis not present

## 2019-09-13 DIAGNOSIS — R609 Edema, unspecified: Secondary | ICD-10-CM | POA: Diagnosis not present

## 2019-10-19 DIAGNOSIS — Z23 Encounter for immunization: Secondary | ICD-10-CM | POA: Diagnosis not present

## 2019-11-16 DIAGNOSIS — Z23 Encounter for immunization: Secondary | ICD-10-CM | POA: Diagnosis not present

## 2020-02-28 DIAGNOSIS — F419 Anxiety disorder, unspecified: Secondary | ICD-10-CM | POA: Diagnosis not present

## 2020-02-28 DIAGNOSIS — R0902 Hypoxemia: Secondary | ICD-10-CM | POA: Diagnosis not present

## 2020-02-28 DIAGNOSIS — I959 Hypotension, unspecified: Secondary | ICD-10-CM | POA: Diagnosis not present

## 2020-02-28 DIAGNOSIS — Z760 Encounter for issue of repeat prescription: Secondary | ICD-10-CM | POA: Diagnosis not present

## 2020-02-28 DIAGNOSIS — Z209 Contact with and (suspected) exposure to unspecified communicable disease: Secondary | ICD-10-CM | POA: Diagnosis not present

## 2020-02-28 DIAGNOSIS — R450 Nervousness: Secondary | ICD-10-CM | POA: Diagnosis not present

## 2020-02-28 DIAGNOSIS — R531 Weakness: Secondary | ICD-10-CM | POA: Diagnosis not present

## 2020-03-16 DIAGNOSIS — L989 Disorder of the skin and subcutaneous tissue, unspecified: Secondary | ICD-10-CM | POA: Diagnosis not present

## 2020-03-16 DIAGNOSIS — Z681 Body mass index (BMI) 19 or less, adult: Secondary | ICD-10-CM | POA: Diagnosis not present

## 2020-03-16 DIAGNOSIS — F17210 Nicotine dependence, cigarettes, uncomplicated: Secondary | ICD-10-CM | POA: Diagnosis not present

## 2020-03-16 DIAGNOSIS — E039 Hypothyroidism, unspecified: Secondary | ICD-10-CM | POA: Diagnosis not present

## 2020-03-16 DIAGNOSIS — Z299 Encounter for prophylactic measures, unspecified: Secondary | ICD-10-CM | POA: Diagnosis not present

## 2020-03-16 DIAGNOSIS — I10 Essential (primary) hypertension: Secondary | ICD-10-CM | POA: Diagnosis not present

## 2020-03-16 DIAGNOSIS — J449 Chronic obstructive pulmonary disease, unspecified: Secondary | ICD-10-CM | POA: Diagnosis not present

## 2020-05-09 DIAGNOSIS — D2361 Other benign neoplasm of skin of right upper limb, including shoulder: Secondary | ICD-10-CM | POA: Diagnosis not present

## 2020-05-23 DIAGNOSIS — Z20822 Contact with and (suspected) exposure to covid-19: Secondary | ICD-10-CM | POA: Diagnosis not present

## 2020-05-23 DIAGNOSIS — Z01812 Encounter for preprocedural laboratory examination: Secondary | ICD-10-CM | POA: Diagnosis not present

## 2020-05-25 DIAGNOSIS — L821 Other seborrheic keratosis: Secondary | ICD-10-CM | POA: Diagnosis not present

## 2020-05-25 DIAGNOSIS — D2361 Other benign neoplasm of skin of right upper limb, including shoulder: Secondary | ICD-10-CM | POA: Diagnosis not present

## 2020-06-13 DIAGNOSIS — Z299 Encounter for prophylactic measures, unspecified: Secondary | ICD-10-CM | POA: Diagnosis not present

## 2020-06-13 DIAGNOSIS — Z7189 Other specified counseling: Secondary | ICD-10-CM | POA: Diagnosis not present

## 2020-06-13 DIAGNOSIS — R5383 Other fatigue: Secondary | ICD-10-CM | POA: Diagnosis not present

## 2020-06-13 DIAGNOSIS — E039 Hypothyroidism, unspecified: Secondary | ICD-10-CM | POA: Diagnosis not present

## 2020-06-13 DIAGNOSIS — E559 Vitamin D deficiency, unspecified: Secondary | ICD-10-CM | POA: Diagnosis not present

## 2020-06-13 DIAGNOSIS — Z681 Body mass index (BMI) 19 or less, adult: Secondary | ICD-10-CM | POA: Diagnosis not present

## 2020-06-13 DIAGNOSIS — E7800 Pure hypercholesterolemia, unspecified: Secondary | ICD-10-CM | POA: Diagnosis not present

## 2020-06-13 DIAGNOSIS — Z0000 Encounter for general adult medical examination without abnormal findings: Secondary | ICD-10-CM | POA: Diagnosis not present

## 2020-06-13 DIAGNOSIS — Z1331 Encounter for screening for depression: Secondary | ICD-10-CM | POA: Diagnosis not present

## 2020-06-13 DIAGNOSIS — Z1339 Encounter for screening examination for other mental health and behavioral disorders: Secondary | ICD-10-CM | POA: Diagnosis not present

## 2020-06-13 DIAGNOSIS — Z79899 Other long term (current) drug therapy: Secondary | ICD-10-CM | POA: Diagnosis not present

## 2020-06-13 DIAGNOSIS — L821 Other seborrheic keratosis: Secondary | ICD-10-CM | POA: Diagnosis not present

## 2020-07-31 IMAGING — DX DG PORTABLE PELVIS
1 series · 1 of 1 positions shown · non-contrast
Comparison: Radiograph 04/12/2019

CLINICAL DATA: Left hip arthroplasty

EXAM:
PORTABLE PELVIS 1-2 VIEWS

[pelvis ap]
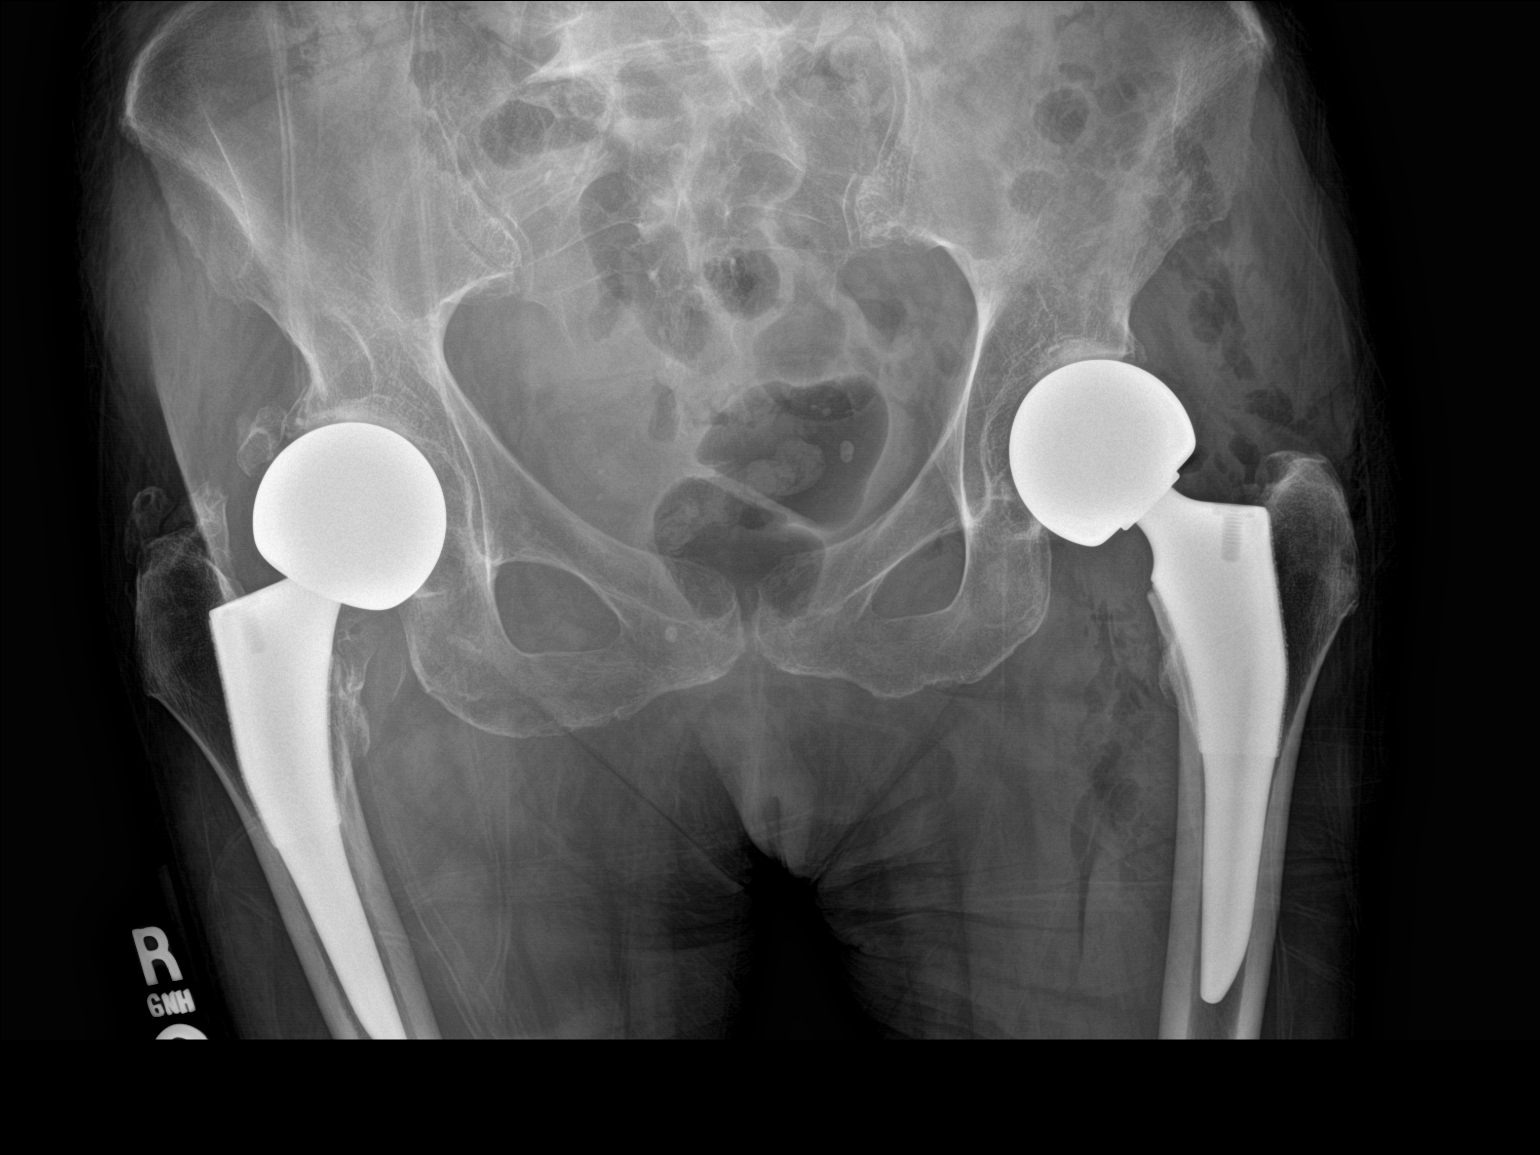

[1 of 1 positions shown; findings below may reference images not displayed]

FINDINGS: Patient is post left hip hemiarthroplasty. Expected alignment of the
articulating femoral component. Right hip hemiarthroplasty is noted
as well. Mild bilateral hip osteoarthrosis is present with sclerotic
changes of the acetabuli. Postsurgical soft tissue changes are noted
over the left hip with soft tissue gas, swelling and left hip
intra-articular gas as well. Remaining soft tissues are
unremarkable.
IMPRESSION: 1. Expected postoperative changes of recent left hip
hemiarthroplasty without evidence of hardware complication.
2. Mild bilateral hip osteoarthrosis, similar to prior study.

## 2020-07-31 IMAGING — DX DG WRIST 2V*L*
2 series · 2 of 2 positions shown · non-contrast
Comparison: 04/11/2019

CLINICAL DATA: Wrist fracture, follow-up

EXAM:
LEFT WRIST - 2 VIEW

[wrist ap]
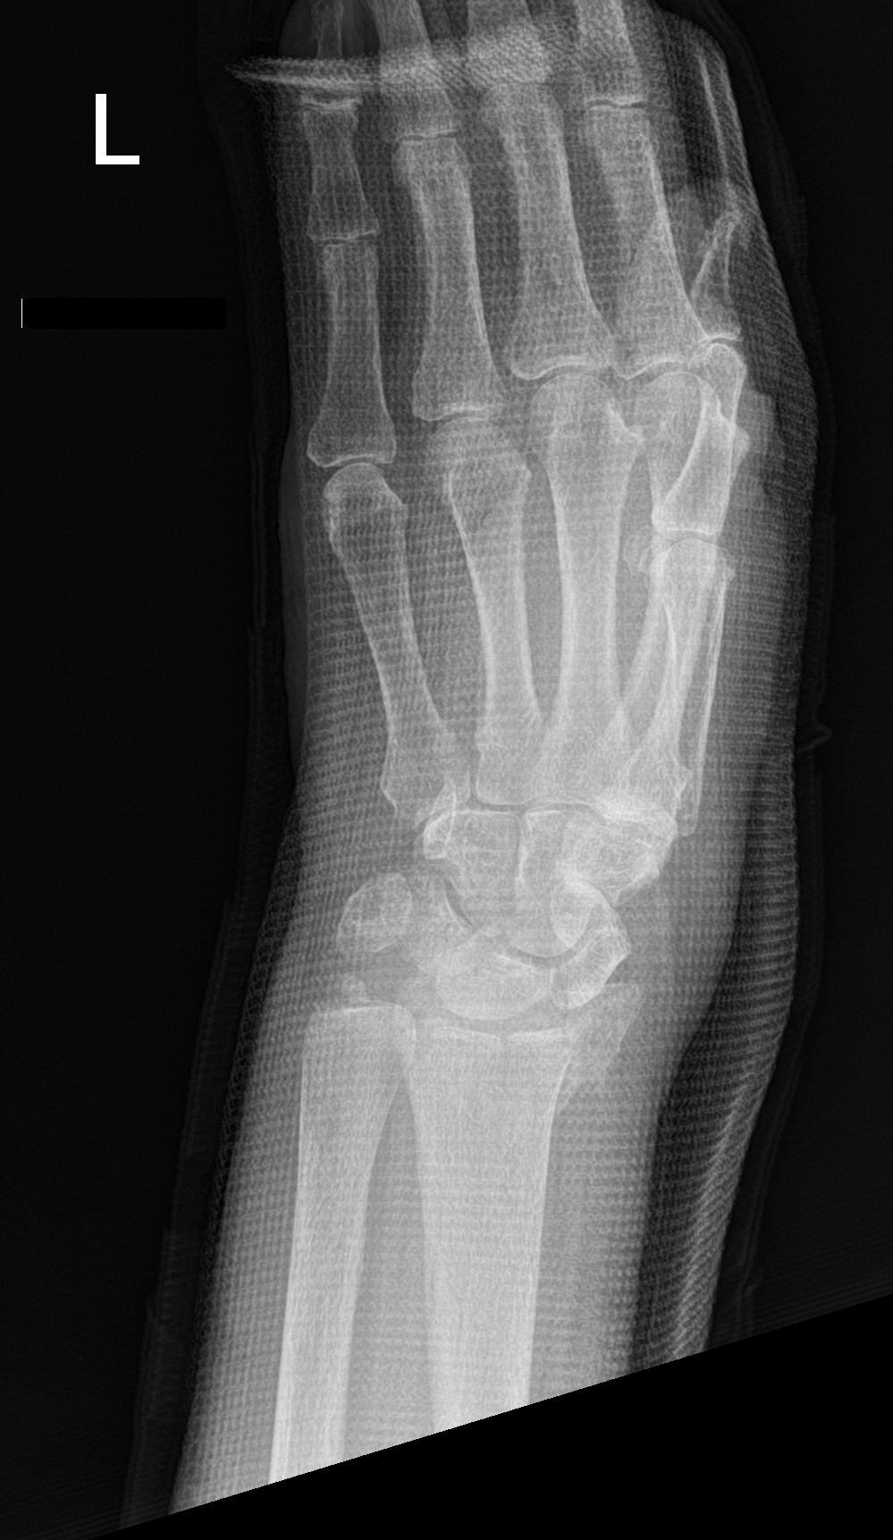

[wrist lat]
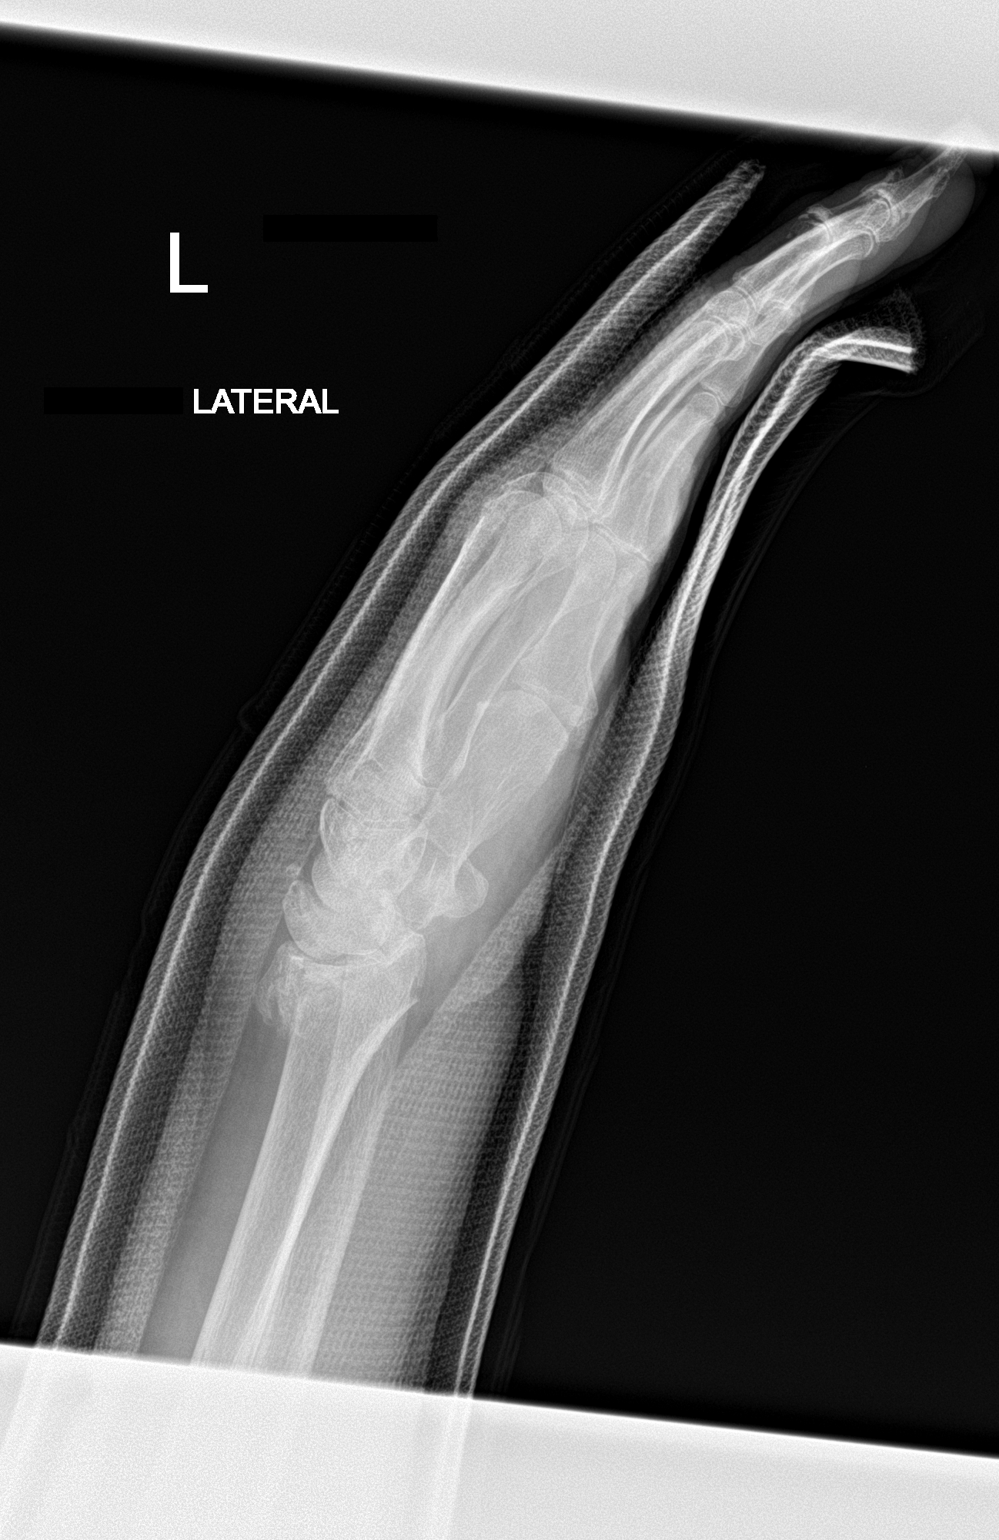

[2 of 2 positions shown; findings below may reference images not displayed]

FINDINGS: In cast views of the left wrist again demonstrate the comminuted
intra-articular displaced distal left radial fracture. Continued
mild displacement of fracture fragments and slight posterior
angulation noted on the lateral view.
IMPRESSION: Distal left radial fracture with continued mild displaced fracture
fragments and angulation.

## 2020-12-25 DIAGNOSIS — F1320 Sedative, hypnotic or anxiolytic dependence, uncomplicated: Secondary | ICD-10-CM | POA: Diagnosis not present

## 2020-12-25 DIAGNOSIS — J449 Chronic obstructive pulmonary disease, unspecified: Secondary | ICD-10-CM | POA: Diagnosis not present

## 2020-12-25 DIAGNOSIS — I10 Essential (primary) hypertension: Secondary | ICD-10-CM | POA: Diagnosis not present

## 2020-12-25 DIAGNOSIS — E039 Hypothyroidism, unspecified: Secondary | ICD-10-CM | POA: Diagnosis not present

## 2020-12-25 DIAGNOSIS — Z681 Body mass index (BMI) 19 or less, adult: Secondary | ICD-10-CM | POA: Diagnosis not present

## 2020-12-25 DIAGNOSIS — Z299 Encounter for prophylactic measures, unspecified: Secondary | ICD-10-CM | POA: Diagnosis not present

## 2020-12-25 DIAGNOSIS — F17210 Nicotine dependence, cigarettes, uncomplicated: Secondary | ICD-10-CM | POA: Diagnosis not present

## 2021-01-08 DIAGNOSIS — S0511XA Contusion of eyeball and orbital tissues, right eye, initial encounter: Secondary | ICD-10-CM | POA: Diagnosis not present

## 2021-01-08 DIAGNOSIS — M47812 Spondylosis without myelopathy or radiculopathy, cervical region: Secondary | ICD-10-CM | POA: Diagnosis not present

## 2021-01-08 DIAGNOSIS — M50322 Other cervical disc degeneration at C5-C6 level: Secondary | ICD-10-CM | POA: Diagnosis not present

## 2021-01-08 DIAGNOSIS — M2578 Osteophyte, vertebrae: Secondary | ICD-10-CM | POA: Diagnosis not present

## 2021-01-08 DIAGNOSIS — S0033XA Contusion of nose, initial encounter: Secondary | ICD-10-CM | POA: Diagnosis not present

## 2021-01-08 DIAGNOSIS — S0512XA Contusion of eyeball and orbital tissues, left eye, initial encounter: Secondary | ICD-10-CM | POA: Diagnosis not present

## 2021-01-08 DIAGNOSIS — F17200 Nicotine dependence, unspecified, uncomplicated: Secondary | ICD-10-CM | POA: Diagnosis not present

## 2021-01-08 DIAGNOSIS — S199XXA Unspecified injury of neck, initial encounter: Secondary | ICD-10-CM | POA: Diagnosis not present

## 2021-01-08 DIAGNOSIS — S0083XA Contusion of other part of head, initial encounter: Secondary | ICD-10-CM | POA: Diagnosis not present

## 2021-01-08 DIAGNOSIS — J984 Other disorders of lung: Secondary | ICD-10-CM | POA: Diagnosis not present

## 2021-01-08 DIAGNOSIS — W228XXA Striking against or struck by other objects, initial encounter: Secondary | ICD-10-CM | POA: Diagnosis not present

## 2021-01-08 DIAGNOSIS — M4312 Spondylolisthesis, cervical region: Secondary | ICD-10-CM | POA: Diagnosis not present

## 2021-01-08 DIAGNOSIS — G319 Degenerative disease of nervous system, unspecified: Secondary | ICD-10-CM | POA: Diagnosis not present

## 2021-03-27 DIAGNOSIS — Z2821 Immunization not carried out because of patient refusal: Secondary | ICD-10-CM | POA: Diagnosis not present

## 2021-03-27 DIAGNOSIS — E039 Hypothyroidism, unspecified: Secondary | ICD-10-CM | POA: Diagnosis not present

## 2021-03-27 DIAGNOSIS — Z299 Encounter for prophylactic measures, unspecified: Secondary | ICD-10-CM | POA: Diagnosis not present

## 2021-03-27 DIAGNOSIS — I10 Essential (primary) hypertension: Secondary | ICD-10-CM | POA: Diagnosis not present

## 2021-03-27 DIAGNOSIS — F419 Anxiety disorder, unspecified: Secondary | ICD-10-CM | POA: Diagnosis not present

## 2021-03-27 DIAGNOSIS — Z681 Body mass index (BMI) 19 or less, adult: Secondary | ICD-10-CM | POA: Diagnosis not present

## 2021-03-27 DIAGNOSIS — F17210 Nicotine dependence, cigarettes, uncomplicated: Secondary | ICD-10-CM | POA: Diagnosis not present

## 2021-05-22 DIAGNOSIS — H26491 Other secondary cataract, right eye: Secondary | ICD-10-CM | POA: Diagnosis not present

## 2021-05-29 DIAGNOSIS — H26491 Other secondary cataract, right eye: Secondary | ICD-10-CM | POA: Diagnosis not present

## 2021-05-31 DIAGNOSIS — E7800 Pure hypercholesterolemia, unspecified: Secondary | ICD-10-CM | POA: Diagnosis not present

## 2021-05-31 DIAGNOSIS — Z79899 Other long term (current) drug therapy: Secondary | ICD-10-CM | POA: Diagnosis not present

## 2021-05-31 DIAGNOSIS — E559 Vitamin D deficiency, unspecified: Secondary | ICD-10-CM | POA: Diagnosis not present

## 2021-05-31 DIAGNOSIS — Z299 Encounter for prophylactic measures, unspecified: Secondary | ICD-10-CM | POA: Diagnosis not present

## 2021-05-31 DIAGNOSIS — Z1339 Encounter for screening examination for other mental health and behavioral disorders: Secondary | ICD-10-CM | POA: Diagnosis not present

## 2021-05-31 DIAGNOSIS — Z681 Body mass index (BMI) 19 or less, adult: Secondary | ICD-10-CM | POA: Diagnosis not present

## 2021-05-31 DIAGNOSIS — R5383 Other fatigue: Secondary | ICD-10-CM | POA: Diagnosis not present

## 2021-05-31 DIAGNOSIS — Z1331 Encounter for screening for depression: Secondary | ICD-10-CM | POA: Diagnosis not present

## 2021-05-31 DIAGNOSIS — Z0000 Encounter for general adult medical examination without abnormal findings: Secondary | ICD-10-CM | POA: Diagnosis not present

## 2021-05-31 DIAGNOSIS — Z7189 Other specified counseling: Secondary | ICD-10-CM | POA: Diagnosis not present

## 2021-05-31 DIAGNOSIS — E039 Hypothyroidism, unspecified: Secondary | ICD-10-CM | POA: Diagnosis not present

## 2021-06-18 DIAGNOSIS — J9811 Atelectasis: Secondary | ICD-10-CM | POA: Diagnosis not present

## 2021-06-18 DIAGNOSIS — S2242XA Multiple fractures of ribs, left side, initial encounter for closed fracture: Secondary | ICD-10-CM | POA: Diagnosis not present

## 2021-06-18 DIAGNOSIS — F1320 Sedative, hypnotic or anxiolytic dependence, uncomplicated: Secondary | ICD-10-CM | POA: Diagnosis not present

## 2021-06-18 DIAGNOSIS — J449 Chronic obstructive pulmonary disease, unspecified: Secondary | ICD-10-CM | POA: Diagnosis not present

## 2021-06-18 DIAGNOSIS — I10 Essential (primary) hypertension: Secondary | ICD-10-CM | POA: Diagnosis not present

## 2021-06-18 DIAGNOSIS — J90 Pleural effusion, not elsewhere classified: Secondary | ICD-10-CM | POA: Diagnosis not present

## 2021-06-18 DIAGNOSIS — F17210 Nicotine dependence, cigarettes, uncomplicated: Secondary | ICD-10-CM | POA: Diagnosis not present

## 2021-06-18 DIAGNOSIS — J942 Hemothorax: Secondary | ICD-10-CM | POA: Diagnosis not present

## 2021-06-18 DIAGNOSIS — R0781 Pleurodynia: Secondary | ICD-10-CM | POA: Diagnosis not present

## 2021-06-18 DIAGNOSIS — Z299 Encounter for prophylactic measures, unspecified: Secondary | ICD-10-CM | POA: Diagnosis not present

## 2021-08-16 DIAGNOSIS — F1320 Sedative, hypnotic or anxiolytic dependence, uncomplicated: Secondary | ICD-10-CM | POA: Diagnosis not present

## 2021-08-16 DIAGNOSIS — L03119 Cellulitis of unspecified part of limb: Secondary | ICD-10-CM | POA: Diagnosis not present

## 2021-08-16 DIAGNOSIS — Z299 Encounter for prophylactic measures, unspecified: Secondary | ICD-10-CM | POA: Diagnosis not present

## 2021-08-16 DIAGNOSIS — I10 Essential (primary) hypertension: Secondary | ICD-10-CM | POA: Diagnosis not present

## 2021-08-29 DIAGNOSIS — I10 Essential (primary) hypertension: Secondary | ICD-10-CM | POA: Diagnosis not present

## 2021-08-29 DIAGNOSIS — J449 Chronic obstructive pulmonary disease, unspecified: Secondary | ICD-10-CM | POA: Diagnosis not present

## 2021-08-29 DIAGNOSIS — Z299 Encounter for prophylactic measures, unspecified: Secondary | ICD-10-CM | POA: Diagnosis not present

## 2021-08-29 DIAGNOSIS — Z681 Body mass index (BMI) 19 or less, adult: Secondary | ICD-10-CM | POA: Diagnosis not present

## 2021-08-29 DIAGNOSIS — F17210 Nicotine dependence, cigarettes, uncomplicated: Secondary | ICD-10-CM | POA: Diagnosis not present

## 2021-12-10 DIAGNOSIS — E7800 Pure hypercholesterolemia, unspecified: Secondary | ICD-10-CM | POA: Diagnosis not present

## 2021-12-10 DIAGNOSIS — Z299 Encounter for prophylactic measures, unspecified: Secondary | ICD-10-CM | POA: Diagnosis not present

## 2021-12-10 DIAGNOSIS — E039 Hypothyroidism, unspecified: Secondary | ICD-10-CM | POA: Diagnosis not present

## 2021-12-10 DIAGNOSIS — Z681 Body mass index (BMI) 19 or less, adult: Secondary | ICD-10-CM | POA: Diagnosis not present

## 2021-12-10 DIAGNOSIS — I10 Essential (primary) hypertension: Secondary | ICD-10-CM | POA: Diagnosis not present

## 2021-12-10 DIAGNOSIS — Z0000 Encounter for general adult medical examination without abnormal findings: Secondary | ICD-10-CM | POA: Diagnosis not present

## 2022-03-12 DIAGNOSIS — Z681 Body mass index (BMI) 19 or less, adult: Secondary | ICD-10-CM | POA: Diagnosis not present

## 2022-03-12 DIAGNOSIS — Z299 Encounter for prophylactic measures, unspecified: Secondary | ICD-10-CM | POA: Diagnosis not present

## 2022-03-12 DIAGNOSIS — M67449 Ganglion, unspecified hand: Secondary | ICD-10-CM | POA: Diagnosis not present

## 2022-03-12 DIAGNOSIS — I10 Essential (primary) hypertension: Secondary | ICD-10-CM | POA: Diagnosis not present

## 2022-03-12 DIAGNOSIS — F17210 Nicotine dependence, cigarettes, uncomplicated: Secondary | ICD-10-CM | POA: Diagnosis not present

## 2022-03-12 DIAGNOSIS — Z23 Encounter for immunization: Secondary | ICD-10-CM | POA: Diagnosis not present

## 2022-03-12 DIAGNOSIS — J449 Chronic obstructive pulmonary disease, unspecified: Secondary | ICD-10-CM | POA: Diagnosis not present

## 2022-04-07 DIAGNOSIS — I10 Essential (primary) hypertension: Secondary | ICD-10-CM | POA: Diagnosis not present

## 2022-04-07 DIAGNOSIS — K5900 Constipation, unspecified: Secondary | ICD-10-CM | POA: Diagnosis not present

## 2022-04-07 DIAGNOSIS — R09A2 Foreign body sensation, throat: Secondary | ICD-10-CM | POA: Diagnosis not present

## 2022-04-11 DIAGNOSIS — I10 Essential (primary) hypertension: Secondary | ICD-10-CM | POA: Diagnosis not present

## 2022-04-11 DIAGNOSIS — Z299 Encounter for prophylactic measures, unspecified: Secondary | ICD-10-CM | POA: Diagnosis not present

## 2022-04-11 DIAGNOSIS — F17210 Nicotine dependence, cigarettes, uncomplicated: Secondary | ICD-10-CM | POA: Diagnosis not present

## 2022-04-11 DIAGNOSIS — Z681 Body mass index (BMI) 19 or less, adult: Secondary | ICD-10-CM | POA: Diagnosis not present

## 2022-04-11 DIAGNOSIS — F1320 Sedative, hypnotic or anxiolytic dependence, uncomplicated: Secondary | ICD-10-CM | POA: Diagnosis not present

## 2022-05-22 DIAGNOSIS — R579 Shock, unspecified: Secondary | ICD-10-CM | POA: Diagnosis not present

## 2022-05-22 DIAGNOSIS — R627 Adult failure to thrive: Secondary | ICD-10-CM | POA: Diagnosis not present

## 2022-05-22 DIAGNOSIS — R29898 Other symptoms and signs involving the musculoskeletal system: Secondary | ICD-10-CM | POA: Diagnosis not present

## 2022-05-22 DIAGNOSIS — R079 Chest pain, unspecified: Secondary | ICD-10-CM | POA: Diagnosis not present

## 2022-05-22 DIAGNOSIS — S22060D Wedge compression fracture of T7-T8 vertebra, subsequent encounter for fracture with routine healing: Secondary | ICD-10-CM | POA: Diagnosis not present

## 2022-05-22 DIAGNOSIS — J9819 Other pulmonary collapse: Secondary | ICD-10-CM | POA: Diagnosis not present

## 2022-05-22 DIAGNOSIS — R001 Bradycardia, unspecified: Secondary | ICD-10-CM | POA: Diagnosis not present

## 2022-05-22 DIAGNOSIS — Z452 Encounter for adjustment and management of vascular access device: Secondary | ICD-10-CM | POA: Diagnosis not present

## 2022-05-22 DIAGNOSIS — J984 Other disorders of lung: Secondary | ICD-10-CM | POA: Diagnosis not present

## 2022-05-22 DIAGNOSIS — R41841 Cognitive communication deficit: Secondary | ICD-10-CM | POA: Diagnosis not present

## 2022-05-22 DIAGNOSIS — I493 Ventricular premature depolarization: Secondary | ICD-10-CM | POA: Diagnosis not present

## 2022-05-22 DIAGNOSIS — N179 Acute kidney failure, unspecified: Secondary | ICD-10-CM | POA: Diagnosis not present

## 2022-05-22 DIAGNOSIS — H9190 Unspecified hearing loss, unspecified ear: Secondary | ICD-10-CM | POA: Diagnosis not present

## 2022-05-22 DIAGNOSIS — M6282 Rhabdomyolysis: Secondary | ICD-10-CM | POA: Diagnosis not present

## 2022-05-22 DIAGNOSIS — I10 Essential (primary) hypertension: Secondary | ICD-10-CM | POA: Diagnosis not present

## 2022-05-22 DIAGNOSIS — L89311 Pressure ulcer of right buttock, stage 1: Secondary | ICD-10-CM | POA: Diagnosis not present

## 2022-05-22 DIAGNOSIS — Z20822 Contact with and (suspected) exposure to covid-19: Secondary | ICD-10-CM | POA: Diagnosis not present

## 2022-05-22 DIAGNOSIS — R4182 Altered mental status, unspecified: Secondary | ICD-10-CM | POA: Diagnosis not present

## 2022-05-22 DIAGNOSIS — J151 Pneumonia due to Pseudomonas: Secondary | ICD-10-CM | POA: Diagnosis not present

## 2022-05-22 DIAGNOSIS — J9602 Acute respiratory failure with hypercapnia: Secondary | ICD-10-CM | POA: Diagnosis not present

## 2022-05-22 DIAGNOSIS — R45851 Suicidal ideations: Secondary | ICD-10-CM | POA: Diagnosis not present

## 2022-05-22 DIAGNOSIS — I959 Hypotension, unspecified: Secondary | ICD-10-CM | POA: Diagnosis not present

## 2022-05-22 DIAGNOSIS — M6281 Muscle weakness (generalized): Secondary | ICD-10-CM | POA: Diagnosis not present

## 2022-05-22 DIAGNOSIS — A419 Sepsis, unspecified organism: Secondary | ICD-10-CM | POA: Diagnosis not present

## 2022-05-22 DIAGNOSIS — J9601 Acute respiratory failure with hypoxia: Secondary | ICD-10-CM | POA: Diagnosis not present

## 2022-05-22 DIAGNOSIS — F329 Major depressive disorder, single episode, unspecified: Secondary | ICD-10-CM | POA: Diagnosis not present

## 2022-05-22 DIAGNOSIS — B957 Other staphylococcus as the cause of diseases classified elsewhere: Secondary | ICD-10-CM | POA: Diagnosis not present

## 2022-05-22 DIAGNOSIS — R6521 Severe sepsis with septic shock: Secondary | ICD-10-CM | POA: Diagnosis not present

## 2022-05-22 DIAGNOSIS — R636 Underweight: Secondary | ICD-10-CM | POA: Diagnosis not present

## 2022-05-22 DIAGNOSIS — M47812 Spondylosis without myelopathy or radiculopathy, cervical region: Secondary | ICD-10-CM | POA: Diagnosis not present

## 2022-05-22 DIAGNOSIS — R262 Difficulty in walking, not elsewhere classified: Secondary | ICD-10-CM | POA: Diagnosis not present

## 2022-05-22 DIAGNOSIS — R4020 Unspecified coma: Secondary | ICD-10-CM | POA: Diagnosis not present

## 2022-05-22 DIAGNOSIS — F0390 Unspecified dementia without behavioral disturbance: Secondary | ICD-10-CM | POA: Diagnosis not present

## 2022-05-22 DIAGNOSIS — E43 Unspecified severe protein-calorie malnutrition: Secondary | ICD-10-CM | POA: Diagnosis not present

## 2022-05-22 DIAGNOSIS — R279 Unspecified lack of coordination: Secondary | ICD-10-CM | POA: Diagnosis not present

## 2022-05-22 DIAGNOSIS — Z743 Need for continuous supervision: Secondary | ICD-10-CM | POA: Diagnosis not present

## 2022-05-22 DIAGNOSIS — J810 Acute pulmonary edema: Secondary | ICD-10-CM | POA: Diagnosis not present

## 2022-05-22 DIAGNOSIS — R680 Hypothermia, not associated with low environmental temperature: Secondary | ICD-10-CM | POA: Diagnosis not present

## 2022-05-22 DIAGNOSIS — H6693 Otitis media, unspecified, bilateral: Secondary | ICD-10-CM | POA: Diagnosis not present

## 2022-05-22 DIAGNOSIS — R55 Syncope and collapse: Secondary | ICD-10-CM | POA: Diagnosis not present

## 2022-05-22 DIAGNOSIS — R5381 Other malaise: Secondary | ICD-10-CM | POA: Diagnosis not present

## 2022-05-22 DIAGNOSIS — Z781 Physical restraint status: Secondary | ICD-10-CM | POA: Diagnosis not present

## 2022-05-22 DIAGNOSIS — E039 Hypothyroidism, unspecified: Secondary | ICD-10-CM | POA: Diagnosis not present

## 2022-05-22 DIAGNOSIS — R6520 Severe sepsis without septic shock: Secondary | ICD-10-CM | POA: Diagnosis not present

## 2022-05-22 DIAGNOSIS — F22 Delusional disorders: Secondary | ICD-10-CM | POA: Diagnosis not present

## 2022-05-22 DIAGNOSIS — R4189 Other symptoms and signs involving cognitive functions and awareness: Secondary | ICD-10-CM | POA: Diagnosis not present

## 2022-05-22 DIAGNOSIS — F05 Delirium due to known physiological condition: Secondary | ICD-10-CM | POA: Diagnosis not present

## 2022-05-22 DIAGNOSIS — R1310 Dysphagia, unspecified: Secondary | ICD-10-CM | POA: Diagnosis not present

## 2022-05-22 DIAGNOSIS — H6593 Unspecified nonsuppurative otitis media, bilateral: Secondary | ICD-10-CM | POA: Diagnosis not present

## 2022-05-22 DIAGNOSIS — F600 Paranoid personality disorder: Secondary | ICD-10-CM | POA: Diagnosis not present

## 2022-05-22 DIAGNOSIS — Z4682 Encounter for fitting and adjustment of non-vascular catheter: Secondary | ICD-10-CM | POA: Diagnosis not present

## 2022-05-22 DIAGNOSIS — Z043 Encounter for examination and observation following other accident: Secondary | ICD-10-CM | POA: Diagnosis not present

## 2022-05-22 DIAGNOSIS — R1312 Dysphagia, oropharyngeal phase: Secondary | ICD-10-CM | POA: Diagnosis not present

## 2022-05-22 DIAGNOSIS — Z4659 Encounter for fitting and adjustment of other gastrointestinal appliance and device: Secondary | ICD-10-CM | POA: Diagnosis not present

## 2022-05-22 DIAGNOSIS — G928 Other toxic encephalopathy: Secondary | ICD-10-CM | POA: Diagnosis not present

## 2022-05-22 DIAGNOSIS — R419 Unspecified symptoms and signs involving cognitive functions and awareness: Secondary | ICD-10-CM | POA: Diagnosis not present

## 2022-05-22 DIAGNOSIS — J90 Pleural effusion, not elsewhere classified: Secondary | ICD-10-CM | POA: Diagnosis not present

## 2022-05-22 DIAGNOSIS — I491 Atrial premature depolarization: Secondary | ICD-10-CM | POA: Diagnosis not present

## 2022-05-22 DIAGNOSIS — R410 Disorientation, unspecified: Secondary | ICD-10-CM | POA: Diagnosis not present

## 2022-05-22 DIAGNOSIS — T68XXXA Hypothermia, initial encounter: Secondary | ICD-10-CM | POA: Diagnosis not present

## 2022-05-22 DIAGNOSIS — M488X4 Other specified spondylopathies, thoracic region: Secondary | ICD-10-CM | POA: Diagnosis not present

## 2022-06-11 DIAGNOSIS — Z79899 Other long term (current) drug therapy: Secondary | ICD-10-CM | POA: Diagnosis not present

## 2022-06-11 DIAGNOSIS — R531 Weakness: Secondary | ICD-10-CM | POA: Diagnosis not present

## 2022-06-11 DIAGNOSIS — S3993XA Unspecified injury of pelvis, initial encounter: Secondary | ICD-10-CM | POA: Diagnosis not present

## 2022-06-11 DIAGNOSIS — F0390 Unspecified dementia without behavioral disturbance: Secondary | ICD-10-CM | POA: Diagnosis not present

## 2022-06-11 DIAGNOSIS — J439 Emphysema, unspecified: Secondary | ICD-10-CM | POA: Diagnosis not present

## 2022-06-11 DIAGNOSIS — M4802 Spinal stenosis, cervical region: Secondary | ICD-10-CM | POA: Diagnosis not present

## 2022-06-11 DIAGNOSIS — Z7401 Bed confinement status: Secondary | ICD-10-CM | POA: Diagnosis not present

## 2022-06-11 DIAGNOSIS — F329 Major depressive disorder, single episode, unspecified: Secondary | ICD-10-CM | POA: Diagnosis not present

## 2022-06-11 DIAGNOSIS — R627 Adult failure to thrive: Secondary | ICD-10-CM | POA: Diagnosis not present

## 2022-06-11 DIAGNOSIS — F600 Paranoid personality disorder: Secondary | ICD-10-CM | POA: Diagnosis not present

## 2022-06-11 DIAGNOSIS — M6281 Muscle weakness (generalized): Secondary | ICD-10-CM | POA: Diagnosis not present

## 2022-06-11 DIAGNOSIS — R1312 Dysphagia, oropharyngeal phase: Secondary | ICD-10-CM | POA: Diagnosis not present

## 2022-06-11 DIAGNOSIS — E039 Hypothyroidism, unspecified: Secondary | ICD-10-CM | POA: Diagnosis not present

## 2022-06-11 DIAGNOSIS — S022XXA Fracture of nasal bones, initial encounter for closed fracture: Secondary | ICD-10-CM | POA: Diagnosis not present

## 2022-06-11 DIAGNOSIS — R41841 Cognitive communication deficit: Secondary | ICD-10-CM | POA: Diagnosis not present

## 2022-06-11 DIAGNOSIS — R1310 Dysphagia, unspecified: Secondary | ICD-10-CM | POA: Diagnosis not present

## 2022-06-11 DIAGNOSIS — W19XXXA Unspecified fall, initial encounter: Secondary | ICD-10-CM | POA: Diagnosis not present

## 2022-06-11 DIAGNOSIS — R609 Edema, unspecified: Secondary | ICD-10-CM | POA: Diagnosis not present

## 2022-06-11 DIAGNOSIS — E43 Unspecified severe protein-calorie malnutrition: Secondary | ICD-10-CM | POA: Diagnosis not present

## 2022-06-11 DIAGNOSIS — R279 Unspecified lack of coordination: Secondary | ICD-10-CM | POA: Diagnosis not present

## 2022-06-11 DIAGNOSIS — J189 Pneumonia, unspecified organism: Secondary | ICD-10-CM | POA: Diagnosis not present

## 2022-06-11 DIAGNOSIS — F4320 Adjustment disorder, unspecified: Secondary | ICD-10-CM | POA: Diagnosis not present

## 2022-06-11 DIAGNOSIS — Z96643 Presence of artificial hip joint, bilateral: Secondary | ICD-10-CM | POA: Diagnosis not present

## 2022-06-11 DIAGNOSIS — R29898 Other symptoms and signs involving the musculoskeletal system: Secondary | ICD-10-CM | POA: Diagnosis not present

## 2022-06-11 DIAGNOSIS — L89152 Pressure ulcer of sacral region, stage 2: Secondary | ICD-10-CM | POA: Diagnosis not present

## 2022-06-11 DIAGNOSIS — M47812 Spondylosis without myelopathy or radiculopathy, cervical region: Secondary | ICD-10-CM | POA: Diagnosis not present

## 2022-06-11 DIAGNOSIS — Z882 Allergy status to sulfonamides status: Secondary | ICD-10-CM | POA: Diagnosis not present

## 2022-06-11 DIAGNOSIS — J9811 Atelectasis: Secondary | ICD-10-CM | POA: Diagnosis not present

## 2022-06-11 DIAGNOSIS — S22060D Wedge compression fracture of T7-T8 vertebra, subsequent encounter for fracture with routine healing: Secondary | ICD-10-CM | POA: Diagnosis not present

## 2022-06-11 DIAGNOSIS — F05 Delirium due to known physiological condition: Secondary | ICD-10-CM | POA: Diagnosis not present

## 2022-06-11 DIAGNOSIS — L89616 Pressure-induced deep tissue damage of right heel: Secondary | ICD-10-CM | POA: Diagnosis not present

## 2022-06-11 DIAGNOSIS — H9190 Unspecified hearing loss, unspecified ear: Secondary | ICD-10-CM | POA: Diagnosis not present

## 2022-06-11 DIAGNOSIS — W06XXXA Fall from bed, initial encounter: Secondary | ICD-10-CM | POA: Diagnosis not present

## 2022-06-11 DIAGNOSIS — F331 Major depressive disorder, recurrent, moderate: Secondary | ICD-10-CM | POA: Diagnosis not present

## 2022-06-11 DIAGNOSIS — E440 Moderate protein-calorie malnutrition: Secondary | ICD-10-CM | POA: Diagnosis not present

## 2022-06-11 DIAGNOSIS — L219 Seborrheic dermatitis, unspecified: Secondary | ICD-10-CM | POA: Diagnosis not present

## 2022-06-11 DIAGNOSIS — S2242XA Multiple fractures of ribs, left side, initial encounter for closed fracture: Secondary | ICD-10-CM | POA: Diagnosis not present

## 2022-06-11 DIAGNOSIS — M4319 Spondylolisthesis, multiple sites in spine: Secondary | ICD-10-CM | POA: Diagnosis not present

## 2022-06-11 DIAGNOSIS — F601 Schizoid personality disorder: Secondary | ICD-10-CM | POA: Diagnosis not present

## 2022-06-11 DIAGNOSIS — A419 Sepsis, unspecified organism: Secondary | ICD-10-CM | POA: Diagnosis not present

## 2022-06-11 DIAGNOSIS — Z743 Need for continuous supervision: Secondary | ICD-10-CM | POA: Diagnosis not present

## 2022-06-11 DIAGNOSIS — D692 Other nonthrombocytopenic purpura: Secondary | ICD-10-CM | POA: Diagnosis not present

## 2022-06-11 DIAGNOSIS — E785 Hyperlipidemia, unspecified: Secondary | ICD-10-CM | POA: Diagnosis not present

## 2022-06-11 DIAGNOSIS — J90 Pleural effusion, not elsewhere classified: Secondary | ICD-10-CM | POA: Diagnosis not present

## 2022-06-11 DIAGNOSIS — S0510XA Contusion of eyeball and orbital tissues, unspecified eye, initial encounter: Secondary | ICD-10-CM | POA: Diagnosis not present

## 2022-06-11 DIAGNOSIS — R4182 Altered mental status, unspecified: Secondary | ICD-10-CM | POA: Diagnosis not present

## 2022-06-11 DIAGNOSIS — M419 Scoliosis, unspecified: Secondary | ICD-10-CM | POA: Diagnosis not present

## 2022-06-11 DIAGNOSIS — R5381 Other malaise: Secondary | ICD-10-CM | POA: Diagnosis not present

## 2022-06-11 DIAGNOSIS — R262 Difficulty in walking, not elsewhere classified: Secondary | ICD-10-CM | POA: Diagnosis not present

## 2022-06-11 DIAGNOSIS — R58 Hemorrhage, not elsewhere classified: Secondary | ICD-10-CM | POA: Diagnosis not present

## 2022-06-11 DIAGNOSIS — I10 Essential (primary) hypertension: Secondary | ICD-10-CM | POA: Diagnosis not present

## 2022-06-12 DIAGNOSIS — S022XXA Fracture of nasal bones, initial encounter for closed fracture: Secondary | ICD-10-CM | POA: Diagnosis not present

## 2022-06-12 DIAGNOSIS — S3993XA Unspecified injury of pelvis, initial encounter: Secondary | ICD-10-CM | POA: Diagnosis not present

## 2022-06-12 DIAGNOSIS — Z882 Allergy status to sulfonamides status: Secondary | ICD-10-CM | POA: Diagnosis not present

## 2022-06-12 DIAGNOSIS — M4319 Spondylolisthesis, multiple sites in spine: Secondary | ICD-10-CM | POA: Diagnosis not present

## 2022-06-12 DIAGNOSIS — Z96643 Presence of artificial hip joint, bilateral: Secondary | ICD-10-CM | POA: Diagnosis not present

## 2022-06-12 DIAGNOSIS — M4802 Spinal stenosis, cervical region: Secondary | ICD-10-CM | POA: Diagnosis not present

## 2022-06-12 DIAGNOSIS — J439 Emphysema, unspecified: Secondary | ICD-10-CM | POA: Diagnosis not present

## 2022-06-12 DIAGNOSIS — M419 Scoliosis, unspecified: Secondary | ICD-10-CM | POA: Diagnosis not present

## 2022-06-12 DIAGNOSIS — M47812 Spondylosis without myelopathy or radiculopathy, cervical region: Secondary | ICD-10-CM | POA: Diagnosis not present

## 2022-06-12 DIAGNOSIS — J9811 Atelectasis: Secondary | ICD-10-CM | POA: Diagnosis not present

## 2022-06-12 DIAGNOSIS — S0510XA Contusion of eyeball and orbital tissues, unspecified eye, initial encounter: Secondary | ICD-10-CM | POA: Diagnosis not present

## 2022-06-12 DIAGNOSIS — I10 Essential (primary) hypertension: Secondary | ICD-10-CM | POA: Diagnosis not present

## 2022-06-12 DIAGNOSIS — S2242XA Multiple fractures of ribs, left side, initial encounter for closed fracture: Secondary | ICD-10-CM | POA: Diagnosis not present

## 2022-06-12 DIAGNOSIS — Z79899 Other long term (current) drug therapy: Secondary | ICD-10-CM | POA: Diagnosis not present

## 2022-06-12 DIAGNOSIS — J90 Pleural effusion, not elsewhere classified: Secondary | ICD-10-CM | POA: Diagnosis not present

## 2022-06-12 DIAGNOSIS — W06XXXA Fall from bed, initial encounter: Secondary | ICD-10-CM | POA: Diagnosis not present

## 2022-06-13 DIAGNOSIS — L219 Seborrheic dermatitis, unspecified: Secondary | ICD-10-CM | POA: Diagnosis not present

## 2022-06-13 DIAGNOSIS — E039 Hypothyroidism, unspecified: Secondary | ICD-10-CM | POA: Diagnosis not present

## 2022-06-13 DIAGNOSIS — I10 Essential (primary) hypertension: Secondary | ICD-10-CM | POA: Diagnosis not present

## 2022-06-13 DIAGNOSIS — R5381 Other malaise: Secondary | ICD-10-CM | POA: Diagnosis not present

## 2022-06-13 DIAGNOSIS — R627 Adult failure to thrive: Secondary | ICD-10-CM | POA: Diagnosis not present

## 2022-06-13 DIAGNOSIS — H9190 Unspecified hearing loss, unspecified ear: Secondary | ICD-10-CM | POA: Diagnosis not present

## 2022-06-13 DIAGNOSIS — E785 Hyperlipidemia, unspecified: Secondary | ICD-10-CM | POA: Diagnosis not present

## 2022-06-13 DIAGNOSIS — R531 Weakness: Secondary | ICD-10-CM | POA: Diagnosis not present

## 2022-06-13 DIAGNOSIS — E440 Moderate protein-calorie malnutrition: Secondary | ICD-10-CM | POA: Diagnosis not present

## 2022-06-20 DIAGNOSIS — R5381 Other malaise: Secondary | ICD-10-CM | POA: Diagnosis not present

## 2022-06-20 DIAGNOSIS — R531 Weakness: Secondary | ICD-10-CM | POA: Diagnosis not present

## 2022-06-20 DIAGNOSIS — I10 Essential (primary) hypertension: Secondary | ICD-10-CM | POA: Diagnosis not present

## 2022-06-20 DIAGNOSIS — E440 Moderate protein-calorie malnutrition: Secondary | ICD-10-CM | POA: Diagnosis not present

## 2022-06-20 DIAGNOSIS — E039 Hypothyroidism, unspecified: Secondary | ICD-10-CM | POA: Diagnosis not present

## 2022-06-20 DIAGNOSIS — R627 Adult failure to thrive: Secondary | ICD-10-CM | POA: Diagnosis not present

## 2022-06-25 DIAGNOSIS — L89616 Pressure-induced deep tissue damage of right heel: Secondary | ICD-10-CM | POA: Diagnosis not present

## 2022-06-25 DIAGNOSIS — J189 Pneumonia, unspecified organism: Secondary | ICD-10-CM | POA: Diagnosis not present

## 2022-06-25 DIAGNOSIS — R627 Adult failure to thrive: Secondary | ICD-10-CM | POA: Diagnosis not present

## 2022-06-25 DIAGNOSIS — F0390 Unspecified dementia without behavioral disturbance: Secondary | ICD-10-CM | POA: Diagnosis not present

## 2022-06-25 DIAGNOSIS — A419 Sepsis, unspecified organism: Secondary | ICD-10-CM | POA: Diagnosis not present

## 2022-06-25 DIAGNOSIS — M6281 Muscle weakness (generalized): Secondary | ICD-10-CM | POA: Diagnosis not present

## 2022-06-25 DIAGNOSIS — R5381 Other malaise: Secondary | ICD-10-CM | POA: Diagnosis not present

## 2022-07-02 DIAGNOSIS — L89152 Pressure ulcer of sacral region, stage 2: Secondary | ICD-10-CM | POA: Diagnosis not present

## 2022-07-02 DIAGNOSIS — R531 Weakness: Secondary | ICD-10-CM | POA: Diagnosis not present

## 2022-07-02 DIAGNOSIS — F0390 Unspecified dementia without behavioral disturbance: Secondary | ICD-10-CM | POA: Diagnosis not present

## 2022-07-02 DIAGNOSIS — R627 Adult failure to thrive: Secondary | ICD-10-CM | POA: Diagnosis not present

## 2022-07-02 DIAGNOSIS — R1312 Dysphagia, oropharyngeal phase: Secondary | ICD-10-CM | POA: Diagnosis not present

## 2022-07-02 DIAGNOSIS — F4320 Adjustment disorder, unspecified: Secondary | ICD-10-CM | POA: Diagnosis not present

## 2022-07-02 DIAGNOSIS — D692 Other nonthrombocytopenic purpura: Secondary | ICD-10-CM | POA: Diagnosis not present

## 2022-07-02 DIAGNOSIS — F601 Schizoid personality disorder: Secondary | ICD-10-CM | POA: Diagnosis not present

## 2022-07-02 DIAGNOSIS — M6281 Muscle weakness (generalized): Secondary | ICD-10-CM | POA: Diagnosis not present

## 2022-07-02 DIAGNOSIS — F331 Major depressive disorder, recurrent, moderate: Secondary | ICD-10-CM | POA: Diagnosis not present

## 2022-07-02 DIAGNOSIS — E039 Hypothyroidism, unspecified: Secondary | ICD-10-CM | POA: Diagnosis not present

## 2022-07-02 DIAGNOSIS — I10 Essential (primary) hypertension: Secondary | ICD-10-CM | POA: Diagnosis not present

## 2022-07-02 DIAGNOSIS — R5381 Other malaise: Secondary | ICD-10-CM | POA: Diagnosis not present

## 2022-07-09 DIAGNOSIS — R5381 Other malaise: Secondary | ICD-10-CM | POA: Diagnosis not present

## 2022-07-09 DIAGNOSIS — E039 Hypothyroidism, unspecified: Secondary | ICD-10-CM | POA: Diagnosis not present

## 2022-07-09 DIAGNOSIS — L89152 Pressure ulcer of sacral region, stage 2: Secondary | ICD-10-CM | POA: Diagnosis not present

## 2022-07-09 DIAGNOSIS — R627 Adult failure to thrive: Secondary | ICD-10-CM | POA: Diagnosis not present

## 2022-07-09 DIAGNOSIS — M6281 Muscle weakness (generalized): Secondary | ICD-10-CM | POA: Diagnosis not present

## 2022-07-09 DIAGNOSIS — R1312 Dysphagia, oropharyngeal phase: Secondary | ICD-10-CM | POA: Diagnosis not present

## 2022-07-09 DIAGNOSIS — F0390 Unspecified dementia without behavioral disturbance: Secondary | ICD-10-CM | POA: Diagnosis not present

## 2022-07-09 DIAGNOSIS — I10 Essential (primary) hypertension: Secondary | ICD-10-CM | POA: Diagnosis not present

## 2022-07-12 DIAGNOSIS — R279 Unspecified lack of coordination: Secondary | ICD-10-CM | POA: Diagnosis not present

## 2022-07-12 DIAGNOSIS — R1312 Dysphagia, oropharyngeal phase: Secondary | ICD-10-CM | POA: Diagnosis not present

## 2022-07-12 DIAGNOSIS — R41841 Cognitive communication deficit: Secondary | ICD-10-CM | POA: Diagnosis not present

## 2022-07-12 DIAGNOSIS — M6281 Muscle weakness (generalized): Secondary | ICD-10-CM | POA: Diagnosis not present

## 2022-07-12 DIAGNOSIS — R262 Difficulty in walking, not elsewhere classified: Secondary | ICD-10-CM | POA: Diagnosis not present

## 2022-07-13 DIAGNOSIS — M6281 Muscle weakness (generalized): Secondary | ICD-10-CM | POA: Diagnosis not present

## 2022-07-13 DIAGNOSIS — R262 Difficulty in walking, not elsewhere classified: Secondary | ICD-10-CM | POA: Diagnosis not present

## 2022-07-13 DIAGNOSIS — R1312 Dysphagia, oropharyngeal phase: Secondary | ICD-10-CM | POA: Diagnosis not present

## 2022-07-13 DIAGNOSIS — R279 Unspecified lack of coordination: Secondary | ICD-10-CM | POA: Diagnosis not present

## 2022-07-13 DIAGNOSIS — R41841 Cognitive communication deficit: Secondary | ICD-10-CM | POA: Diagnosis not present

## 2022-07-14 DIAGNOSIS — R262 Difficulty in walking, not elsewhere classified: Secondary | ICD-10-CM | POA: Diagnosis not present

## 2022-07-14 DIAGNOSIS — R42 Dizziness and giddiness: Secondary | ICD-10-CM | POA: Diagnosis not present

## 2022-07-14 DIAGNOSIS — R627 Adult failure to thrive: Secondary | ICD-10-CM | POA: Diagnosis not present

## 2022-07-14 DIAGNOSIS — E440 Moderate protein-calorie malnutrition: Secondary | ICD-10-CM | POA: Diagnosis not present

## 2022-07-14 DIAGNOSIS — M6281 Muscle weakness (generalized): Secondary | ICD-10-CM | POA: Diagnosis not present

## 2022-07-14 DIAGNOSIS — R1312 Dysphagia, oropharyngeal phase: Secondary | ICD-10-CM | POA: Diagnosis not present

## 2022-07-14 DIAGNOSIS — R279 Unspecified lack of coordination: Secondary | ICD-10-CM | POA: Diagnosis not present

## 2022-07-14 DIAGNOSIS — R41841 Cognitive communication deficit: Secondary | ICD-10-CM | POA: Diagnosis not present

## 2022-07-16 DIAGNOSIS — H903 Sensorineural hearing loss, bilateral: Secondary | ICD-10-CM | POA: Diagnosis not present

## 2022-07-23 DIAGNOSIS — E559 Vitamin D deficiency, unspecified: Secondary | ICD-10-CM | POA: Diagnosis not present

## 2022-07-23 DIAGNOSIS — M6281 Muscle weakness (generalized): Secondary | ICD-10-CM | POA: Diagnosis not present

## 2022-07-23 DIAGNOSIS — F600 Paranoid personality disorder: Secondary | ICD-10-CM | POA: Diagnosis not present

## 2022-07-23 DIAGNOSIS — I10 Essential (primary) hypertension: Secondary | ICD-10-CM | POA: Diagnosis not present

## 2022-07-23 DIAGNOSIS — F411 Generalized anxiety disorder: Secondary | ICD-10-CM | POA: Diagnosis not present

## 2022-07-23 DIAGNOSIS — S22060D Wedge compression fracture of T7-T8 vertebra, subsequent encounter for fracture with routine healing: Secondary | ICD-10-CM | POA: Diagnosis not present

## 2022-07-23 DIAGNOSIS — D649 Anemia, unspecified: Secondary | ICD-10-CM | POA: Diagnosis not present

## 2022-07-23 DIAGNOSIS — Z79899 Other long term (current) drug therapy: Secondary | ICD-10-CM | POA: Diagnosis not present

## 2022-07-23 DIAGNOSIS — E441 Mild protein-calorie malnutrition: Secondary | ICD-10-CM | POA: Diagnosis not present

## 2022-07-23 DIAGNOSIS — E119 Type 2 diabetes mellitus without complications: Secondary | ICD-10-CM | POA: Diagnosis not present

## 2022-07-23 DIAGNOSIS — F338 Other recurrent depressive disorders: Secondary | ICD-10-CM | POA: Diagnosis not present

## 2022-07-23 DIAGNOSIS — R1312 Dysphagia, oropharyngeal phase: Secondary | ICD-10-CM | POA: Diagnosis not present

## 2022-07-23 DIAGNOSIS — R41841 Cognitive communication deficit: Secondary | ICD-10-CM | POA: Diagnosis not present

## 2022-07-23 DIAGNOSIS — E039 Hypothyroidism, unspecified: Secondary | ICD-10-CM | POA: Diagnosis not present

## 2022-07-23 DIAGNOSIS — F0390 Unspecified dementia without behavioral disturbance: Secondary | ICD-10-CM | POA: Diagnosis not present

## 2022-07-23 DIAGNOSIS — F331 Major depressive disorder, recurrent, moderate: Secondary | ICD-10-CM | POA: Diagnosis not present

## 2022-07-23 DIAGNOSIS — M47812 Spondylosis without myelopathy or radiculopathy, cervical region: Secondary | ICD-10-CM | POA: Diagnosis not present

## 2022-07-24 DIAGNOSIS — M47812 Spondylosis without myelopathy or radiculopathy, cervical region: Secondary | ICD-10-CM | POA: Diagnosis not present

## 2022-07-24 DIAGNOSIS — I10 Essential (primary) hypertension: Secondary | ICD-10-CM | POA: Diagnosis not present

## 2022-07-24 DIAGNOSIS — S22060D Wedge compression fracture of T7-T8 vertebra, subsequent encounter for fracture with routine healing: Secondary | ICD-10-CM | POA: Diagnosis not present

## 2022-07-24 DIAGNOSIS — M6281 Muscle weakness (generalized): Secondary | ICD-10-CM | POA: Diagnosis not present

## 2022-07-29 DIAGNOSIS — E039 Hypothyroidism, unspecified: Secondary | ICD-10-CM | POA: Diagnosis not present

## 2022-08-04 DIAGNOSIS — F411 Generalized anxiety disorder: Secondary | ICD-10-CM | POA: Diagnosis not present

## 2022-08-06 DIAGNOSIS — M47812 Spondylosis without myelopathy or radiculopathy, cervical region: Secondary | ICD-10-CM | POA: Diagnosis not present

## 2022-08-06 DIAGNOSIS — S22060D Wedge compression fracture of T7-T8 vertebra, subsequent encounter for fracture with routine healing: Secondary | ICD-10-CM | POA: Diagnosis not present

## 2022-08-06 DIAGNOSIS — R41841 Cognitive communication deficit: Secondary | ICD-10-CM | POA: Diagnosis not present

## 2022-08-06 DIAGNOSIS — I10 Essential (primary) hypertension: Secondary | ICD-10-CM | POA: Diagnosis not present

## 2022-08-08 DIAGNOSIS — F411 Generalized anxiety disorder: Secondary | ICD-10-CM | POA: Diagnosis not present

## 2022-08-10 DIAGNOSIS — F0390 Unspecified dementia without behavioral disturbance: Secondary | ICD-10-CM | POA: Diagnosis not present

## 2022-08-10 DIAGNOSIS — R41841 Cognitive communication deficit: Secondary | ICD-10-CM | POA: Diagnosis not present

## 2022-08-10 DIAGNOSIS — M6281 Muscle weakness (generalized): Secondary | ICD-10-CM | POA: Diagnosis not present

## 2022-08-10 DIAGNOSIS — S22060D Wedge compression fracture of T7-T8 vertebra, subsequent encounter for fracture with routine healing: Secondary | ICD-10-CM | POA: Diagnosis not present

## 2022-08-10 DIAGNOSIS — R1312 Dysphagia, oropharyngeal phase: Secondary | ICD-10-CM | POA: Diagnosis not present

## 2022-08-11 DIAGNOSIS — M6281 Muscle weakness (generalized): Secondary | ICD-10-CM | POA: Diagnosis not present

## 2022-08-11 DIAGNOSIS — R41841 Cognitive communication deficit: Secondary | ICD-10-CM | POA: Diagnosis not present

## 2022-08-11 DIAGNOSIS — F0390 Unspecified dementia without behavioral disturbance: Secondary | ICD-10-CM | POA: Diagnosis not present

## 2022-08-11 DIAGNOSIS — R1312 Dysphagia, oropharyngeal phase: Secondary | ICD-10-CM | POA: Diagnosis not present

## 2022-08-11 DIAGNOSIS — S22060D Wedge compression fracture of T7-T8 vertebra, subsequent encounter for fracture with routine healing: Secondary | ICD-10-CM | POA: Diagnosis not present

## 2022-08-12 DIAGNOSIS — R41841 Cognitive communication deficit: Secondary | ICD-10-CM | POA: Diagnosis not present

## 2022-08-12 DIAGNOSIS — S22060D Wedge compression fracture of T7-T8 vertebra, subsequent encounter for fracture with routine healing: Secondary | ICD-10-CM | POA: Diagnosis not present

## 2022-08-12 DIAGNOSIS — M6281 Muscle weakness (generalized): Secondary | ICD-10-CM | POA: Diagnosis not present

## 2022-08-12 DIAGNOSIS — R1312 Dysphagia, oropharyngeal phase: Secondary | ICD-10-CM | POA: Diagnosis not present

## 2022-08-12 DIAGNOSIS — F0390 Unspecified dementia without behavioral disturbance: Secondary | ICD-10-CM | POA: Diagnosis not present

## 2022-08-13 DIAGNOSIS — F331 Major depressive disorder, recurrent, moderate: Secondary | ICD-10-CM | POA: Diagnosis not present

## 2022-08-13 DIAGNOSIS — F411 Generalized anxiety disorder: Secondary | ICD-10-CM | POA: Diagnosis not present

## 2022-08-15 DIAGNOSIS — S22060D Wedge compression fracture of T7-T8 vertebra, subsequent encounter for fracture with routine healing: Secondary | ICD-10-CM | POA: Diagnosis not present

## 2022-08-15 DIAGNOSIS — F0390 Unspecified dementia without behavioral disturbance: Secondary | ICD-10-CM | POA: Diagnosis not present

## 2022-08-15 DIAGNOSIS — R41841 Cognitive communication deficit: Secondary | ICD-10-CM | POA: Diagnosis not present

## 2022-08-15 DIAGNOSIS — R1312 Dysphagia, oropharyngeal phase: Secondary | ICD-10-CM | POA: Diagnosis not present

## 2022-08-15 DIAGNOSIS — M6281 Muscle weakness (generalized): Secondary | ICD-10-CM | POA: Diagnosis not present

## 2022-08-17 DIAGNOSIS — R41841 Cognitive communication deficit: Secondary | ICD-10-CM | POA: Diagnosis not present

## 2022-08-17 DIAGNOSIS — F0390 Unspecified dementia without behavioral disturbance: Secondary | ICD-10-CM | POA: Diagnosis not present

## 2022-08-17 DIAGNOSIS — R1312 Dysphagia, oropharyngeal phase: Secondary | ICD-10-CM | POA: Diagnosis not present

## 2022-08-17 DIAGNOSIS — M6281 Muscle weakness (generalized): Secondary | ICD-10-CM | POA: Diagnosis not present

## 2022-08-17 DIAGNOSIS — S22060D Wedge compression fracture of T7-T8 vertebra, subsequent encounter for fracture with routine healing: Secondary | ICD-10-CM | POA: Diagnosis not present

## 2022-08-18 DIAGNOSIS — R41841 Cognitive communication deficit: Secondary | ICD-10-CM | POA: Diagnosis not present

## 2022-08-18 DIAGNOSIS — M6281 Muscle weakness (generalized): Secondary | ICD-10-CM | POA: Diagnosis not present

## 2022-08-18 DIAGNOSIS — R1312 Dysphagia, oropharyngeal phase: Secondary | ICD-10-CM | POA: Diagnosis not present

## 2022-08-18 DIAGNOSIS — S22060D Wedge compression fracture of T7-T8 vertebra, subsequent encounter for fracture with routine healing: Secondary | ICD-10-CM | POA: Diagnosis not present

## 2022-08-18 DIAGNOSIS — F0390 Unspecified dementia without behavioral disturbance: Secondary | ICD-10-CM | POA: Diagnosis not present

## 2022-08-19 DIAGNOSIS — R1312 Dysphagia, oropharyngeal phase: Secondary | ICD-10-CM | POA: Diagnosis not present

## 2022-08-19 DIAGNOSIS — R41841 Cognitive communication deficit: Secondary | ICD-10-CM | POA: Diagnosis not present

## 2022-08-19 DIAGNOSIS — F0390 Unspecified dementia without behavioral disturbance: Secondary | ICD-10-CM | POA: Diagnosis not present

## 2022-08-19 DIAGNOSIS — S22060D Wedge compression fracture of T7-T8 vertebra, subsequent encounter for fracture with routine healing: Secondary | ICD-10-CM | POA: Diagnosis not present

## 2022-08-19 DIAGNOSIS — M6281 Muscle weakness (generalized): Secondary | ICD-10-CM | POA: Diagnosis not present

## 2022-08-20 DIAGNOSIS — R1312 Dysphagia, oropharyngeal phase: Secondary | ICD-10-CM | POA: Diagnosis not present

## 2022-08-20 DIAGNOSIS — R052 Subacute cough: Secondary | ICD-10-CM | POA: Diagnosis not present

## 2022-08-20 DIAGNOSIS — F0390 Unspecified dementia without behavioral disturbance: Secondary | ICD-10-CM | POA: Diagnosis not present

## 2022-08-20 DIAGNOSIS — S22060D Wedge compression fracture of T7-T8 vertebra, subsequent encounter for fracture with routine healing: Secondary | ICD-10-CM | POA: Diagnosis not present

## 2022-08-20 DIAGNOSIS — R41841 Cognitive communication deficit: Secondary | ICD-10-CM | POA: Diagnosis not present

## 2022-08-20 DIAGNOSIS — M6281 Muscle weakness (generalized): Secondary | ICD-10-CM | POA: Diagnosis not present

## 2022-08-21 DIAGNOSIS — R41841 Cognitive communication deficit: Secondary | ICD-10-CM | POA: Diagnosis not present

## 2022-08-21 DIAGNOSIS — R1312 Dysphagia, oropharyngeal phase: Secondary | ICD-10-CM | POA: Diagnosis not present

## 2022-08-21 DIAGNOSIS — S22060D Wedge compression fracture of T7-T8 vertebra, subsequent encounter for fracture with routine healing: Secondary | ICD-10-CM | POA: Diagnosis not present

## 2022-08-21 DIAGNOSIS — M6281 Muscle weakness (generalized): Secondary | ICD-10-CM | POA: Diagnosis not present

## 2022-08-21 DIAGNOSIS — F0390 Unspecified dementia without behavioral disturbance: Secondary | ICD-10-CM | POA: Diagnosis not present

## 2022-08-22 DIAGNOSIS — R1312 Dysphagia, oropharyngeal phase: Secondary | ICD-10-CM | POA: Diagnosis not present

## 2022-08-22 DIAGNOSIS — M6281 Muscle weakness (generalized): Secondary | ICD-10-CM | POA: Diagnosis not present

## 2022-08-22 DIAGNOSIS — F0390 Unspecified dementia without behavioral disturbance: Secondary | ICD-10-CM | POA: Diagnosis not present

## 2022-08-22 DIAGNOSIS — S22060D Wedge compression fracture of T7-T8 vertebra, subsequent encounter for fracture with routine healing: Secondary | ICD-10-CM | POA: Diagnosis not present

## 2022-08-22 DIAGNOSIS — R41841 Cognitive communication deficit: Secondary | ICD-10-CM | POA: Diagnosis not present

## 2022-08-23 DIAGNOSIS — F0390 Unspecified dementia without behavioral disturbance: Secondary | ICD-10-CM | POA: Diagnosis not present

## 2022-08-23 DIAGNOSIS — S22060D Wedge compression fracture of T7-T8 vertebra, subsequent encounter for fracture with routine healing: Secondary | ICD-10-CM | POA: Diagnosis not present

## 2022-08-23 DIAGNOSIS — R1312 Dysphagia, oropharyngeal phase: Secondary | ICD-10-CM | POA: Diagnosis not present

## 2022-08-23 DIAGNOSIS — R41841 Cognitive communication deficit: Secondary | ICD-10-CM | POA: Diagnosis not present

## 2022-08-23 DIAGNOSIS — M6281 Muscle weakness (generalized): Secondary | ICD-10-CM | POA: Diagnosis not present

## 2022-08-24 DIAGNOSIS — R1312 Dysphagia, oropharyngeal phase: Secondary | ICD-10-CM | POA: Diagnosis not present

## 2022-08-24 DIAGNOSIS — R41841 Cognitive communication deficit: Secondary | ICD-10-CM | POA: Diagnosis not present

## 2022-08-24 DIAGNOSIS — S22060D Wedge compression fracture of T7-T8 vertebra, subsequent encounter for fracture with routine healing: Secondary | ICD-10-CM | POA: Diagnosis not present

## 2022-08-24 DIAGNOSIS — F0390 Unspecified dementia without behavioral disturbance: Secondary | ICD-10-CM | POA: Diagnosis not present

## 2022-08-24 DIAGNOSIS — M6281 Muscle weakness (generalized): Secondary | ICD-10-CM | POA: Diagnosis not present

## 2022-08-25 DIAGNOSIS — F0390 Unspecified dementia without behavioral disturbance: Secondary | ICD-10-CM | POA: Diagnosis not present

## 2022-08-25 DIAGNOSIS — S22060D Wedge compression fracture of T7-T8 vertebra, subsequent encounter for fracture with routine healing: Secondary | ICD-10-CM | POA: Diagnosis not present

## 2022-08-25 DIAGNOSIS — R41841 Cognitive communication deficit: Secondary | ICD-10-CM | POA: Diagnosis not present

## 2022-08-25 DIAGNOSIS — M6281 Muscle weakness (generalized): Secondary | ICD-10-CM | POA: Diagnosis not present

## 2022-08-25 DIAGNOSIS — R1312 Dysphagia, oropharyngeal phase: Secondary | ICD-10-CM | POA: Diagnosis not present

## 2022-08-26 DIAGNOSIS — S22060D Wedge compression fracture of T7-T8 vertebra, subsequent encounter for fracture with routine healing: Secondary | ICD-10-CM | POA: Diagnosis not present

## 2022-08-26 DIAGNOSIS — R1312 Dysphagia, oropharyngeal phase: Secondary | ICD-10-CM | POA: Diagnosis not present

## 2022-08-26 DIAGNOSIS — M6281 Muscle weakness (generalized): Secondary | ICD-10-CM | POA: Diagnosis not present

## 2022-08-26 DIAGNOSIS — R41841 Cognitive communication deficit: Secondary | ICD-10-CM | POA: Diagnosis not present

## 2022-08-26 DIAGNOSIS — F0390 Unspecified dementia without behavioral disturbance: Secondary | ICD-10-CM | POA: Diagnosis not present

## 2022-08-27 DIAGNOSIS — S22060D Wedge compression fracture of T7-T8 vertebra, subsequent encounter for fracture with routine healing: Secondary | ICD-10-CM | POA: Diagnosis not present

## 2022-08-27 DIAGNOSIS — F411 Generalized anxiety disorder: Secondary | ICD-10-CM | POA: Diagnosis not present

## 2022-08-27 DIAGNOSIS — R41841 Cognitive communication deficit: Secondary | ICD-10-CM | POA: Diagnosis not present

## 2022-08-27 DIAGNOSIS — R1312 Dysphagia, oropharyngeal phase: Secondary | ICD-10-CM | POA: Diagnosis not present

## 2022-08-27 DIAGNOSIS — M6281 Muscle weakness (generalized): Secondary | ICD-10-CM | POA: Diagnosis not present

## 2022-08-27 DIAGNOSIS — F0390 Unspecified dementia without behavioral disturbance: Secondary | ICD-10-CM | POA: Diagnosis not present

## 2022-08-28 DIAGNOSIS — F0390 Unspecified dementia without behavioral disturbance: Secondary | ICD-10-CM | POA: Diagnosis not present

## 2022-08-28 DIAGNOSIS — R1312 Dysphagia, oropharyngeal phase: Secondary | ICD-10-CM | POA: Diagnosis not present

## 2022-08-28 DIAGNOSIS — S22060D Wedge compression fracture of T7-T8 vertebra, subsequent encounter for fracture with routine healing: Secondary | ICD-10-CM | POA: Diagnosis not present

## 2022-08-28 DIAGNOSIS — M6281 Muscle weakness (generalized): Secondary | ICD-10-CM | POA: Diagnosis not present

## 2022-08-28 DIAGNOSIS — R41841 Cognitive communication deficit: Secondary | ICD-10-CM | POA: Diagnosis not present

## 2022-08-29 DIAGNOSIS — R41841 Cognitive communication deficit: Secondary | ICD-10-CM | POA: Diagnosis not present

## 2022-08-29 DIAGNOSIS — M6281 Muscle weakness (generalized): Secondary | ICD-10-CM | POA: Diagnosis not present

## 2022-08-29 DIAGNOSIS — R1312 Dysphagia, oropharyngeal phase: Secondary | ICD-10-CM | POA: Diagnosis not present

## 2022-08-29 DIAGNOSIS — S22060D Wedge compression fracture of T7-T8 vertebra, subsequent encounter for fracture with routine healing: Secondary | ICD-10-CM | POA: Diagnosis not present

## 2022-08-29 DIAGNOSIS — F0390 Unspecified dementia without behavioral disturbance: Secondary | ICD-10-CM | POA: Diagnosis not present

## 2022-09-01 DIAGNOSIS — M6281 Muscle weakness (generalized): Secondary | ICD-10-CM | POA: Diagnosis not present

## 2022-09-01 DIAGNOSIS — S22060D Wedge compression fracture of T7-T8 vertebra, subsequent encounter for fracture with routine healing: Secondary | ICD-10-CM | POA: Diagnosis not present

## 2022-09-01 DIAGNOSIS — F0390 Unspecified dementia without behavioral disturbance: Secondary | ICD-10-CM | POA: Diagnosis not present

## 2022-09-01 DIAGNOSIS — R1312 Dysphagia, oropharyngeal phase: Secondary | ICD-10-CM | POA: Diagnosis not present

## 2022-09-01 DIAGNOSIS — E559 Vitamin D deficiency, unspecified: Secondary | ICD-10-CM | POA: Diagnosis not present

## 2022-09-01 DIAGNOSIS — R41841 Cognitive communication deficit: Secondary | ICD-10-CM | POA: Diagnosis not present

## 2022-09-02 DIAGNOSIS — R41841 Cognitive communication deficit: Secondary | ICD-10-CM | POA: Diagnosis not present

## 2022-09-02 DIAGNOSIS — F0390 Unspecified dementia without behavioral disturbance: Secondary | ICD-10-CM | POA: Diagnosis not present

## 2022-09-02 DIAGNOSIS — M6281 Muscle weakness (generalized): Secondary | ICD-10-CM | POA: Diagnosis not present

## 2022-09-02 DIAGNOSIS — M19011 Primary osteoarthritis, right shoulder: Secondary | ICD-10-CM | POA: Diagnosis not present

## 2022-09-02 DIAGNOSIS — M19012 Primary osteoarthritis, left shoulder: Secondary | ICD-10-CM | POA: Diagnosis not present

## 2022-09-02 DIAGNOSIS — R1312 Dysphagia, oropharyngeal phase: Secondary | ICD-10-CM | POA: Diagnosis not present

## 2022-09-02 DIAGNOSIS — S22060D Wedge compression fracture of T7-T8 vertebra, subsequent encounter for fracture with routine healing: Secondary | ICD-10-CM | POA: Diagnosis not present

## 2022-09-03 DIAGNOSIS — R1312 Dysphagia, oropharyngeal phase: Secondary | ICD-10-CM | POA: Diagnosis not present

## 2022-09-03 DIAGNOSIS — M6281 Muscle weakness (generalized): Secondary | ICD-10-CM | POA: Diagnosis not present

## 2022-09-03 DIAGNOSIS — S22060D Wedge compression fracture of T7-T8 vertebra, subsequent encounter for fracture with routine healing: Secondary | ICD-10-CM | POA: Diagnosis not present

## 2022-09-03 DIAGNOSIS — F331 Major depressive disorder, recurrent, moderate: Secondary | ICD-10-CM | POA: Diagnosis not present

## 2022-09-03 DIAGNOSIS — F411 Generalized anxiety disorder: Secondary | ICD-10-CM | POA: Diagnosis not present

## 2022-09-03 DIAGNOSIS — F0390 Unspecified dementia without behavioral disturbance: Secondary | ICD-10-CM | POA: Diagnosis not present

## 2022-09-03 DIAGNOSIS — R41841 Cognitive communication deficit: Secondary | ICD-10-CM | POA: Diagnosis not present

## 2022-09-04 DIAGNOSIS — S22060D Wedge compression fracture of T7-T8 vertebra, subsequent encounter for fracture with routine healing: Secondary | ICD-10-CM | POA: Diagnosis not present

## 2022-09-04 DIAGNOSIS — R41841 Cognitive communication deficit: Secondary | ICD-10-CM | POA: Diagnosis not present

## 2022-09-04 DIAGNOSIS — F0390 Unspecified dementia without behavioral disturbance: Secondary | ICD-10-CM | POA: Diagnosis not present

## 2022-09-04 DIAGNOSIS — R1312 Dysphagia, oropharyngeal phase: Secondary | ICD-10-CM | POA: Diagnosis not present

## 2022-09-04 DIAGNOSIS — M6281 Muscle weakness (generalized): Secondary | ICD-10-CM | POA: Diagnosis not present

## 2022-09-05 DIAGNOSIS — F0390 Unspecified dementia without behavioral disturbance: Secondary | ICD-10-CM | POA: Diagnosis not present

## 2022-09-05 DIAGNOSIS — S22060D Wedge compression fracture of T7-T8 vertebra, subsequent encounter for fracture with routine healing: Secondary | ICD-10-CM | POA: Diagnosis not present

## 2022-09-05 DIAGNOSIS — M6281 Muscle weakness (generalized): Secondary | ICD-10-CM | POA: Diagnosis not present

## 2022-09-05 DIAGNOSIS — R41841 Cognitive communication deficit: Secondary | ICD-10-CM | POA: Diagnosis not present

## 2022-09-05 DIAGNOSIS — R1312 Dysphagia, oropharyngeal phase: Secondary | ICD-10-CM | POA: Diagnosis not present

## 2022-09-06 DIAGNOSIS — F0390 Unspecified dementia without behavioral disturbance: Secondary | ICD-10-CM | POA: Diagnosis not present

## 2022-09-06 DIAGNOSIS — R1312 Dysphagia, oropharyngeal phase: Secondary | ICD-10-CM | POA: Diagnosis not present

## 2022-09-06 DIAGNOSIS — M6281 Muscle weakness (generalized): Secondary | ICD-10-CM | POA: Diagnosis not present

## 2022-09-06 DIAGNOSIS — R41841 Cognitive communication deficit: Secondary | ICD-10-CM | POA: Diagnosis not present

## 2022-09-06 DIAGNOSIS — S22060D Wedge compression fracture of T7-T8 vertebra, subsequent encounter for fracture with routine healing: Secondary | ICD-10-CM | POA: Diagnosis not present

## 2022-09-08 DIAGNOSIS — R1312 Dysphagia, oropharyngeal phase: Secondary | ICD-10-CM | POA: Diagnosis not present

## 2022-09-08 DIAGNOSIS — R41841 Cognitive communication deficit: Secondary | ICD-10-CM | POA: Diagnosis not present

## 2022-09-08 DIAGNOSIS — M6281 Muscle weakness (generalized): Secondary | ICD-10-CM | POA: Diagnosis not present

## 2022-09-08 DIAGNOSIS — S22060D Wedge compression fracture of T7-T8 vertebra, subsequent encounter for fracture with routine healing: Secondary | ICD-10-CM | POA: Diagnosis not present

## 2022-09-08 DIAGNOSIS — F0390 Unspecified dementia without behavioral disturbance: Secondary | ICD-10-CM | POA: Diagnosis not present

## 2022-09-08 DIAGNOSIS — F411 Generalized anxiety disorder: Secondary | ICD-10-CM | POA: Diagnosis not present

## 2022-09-09 DIAGNOSIS — R1312 Dysphagia, oropharyngeal phase: Secondary | ICD-10-CM | POA: Diagnosis not present

## 2022-09-09 DIAGNOSIS — F0390 Unspecified dementia without behavioral disturbance: Secondary | ICD-10-CM | POA: Diagnosis not present

## 2022-09-09 DIAGNOSIS — S22060D Wedge compression fracture of T7-T8 vertebra, subsequent encounter for fracture with routine healing: Secondary | ICD-10-CM | POA: Diagnosis not present

## 2022-09-09 DIAGNOSIS — M6281 Muscle weakness (generalized): Secondary | ICD-10-CM | POA: Diagnosis not present

## 2022-09-09 DIAGNOSIS — R41841 Cognitive communication deficit: Secondary | ICD-10-CM | POA: Diagnosis not present

## 2022-09-10 DIAGNOSIS — S22060D Wedge compression fracture of T7-T8 vertebra, subsequent encounter for fracture with routine healing: Secondary | ICD-10-CM | POA: Diagnosis not present

## 2022-09-10 DIAGNOSIS — R41841 Cognitive communication deficit: Secondary | ICD-10-CM | POA: Diagnosis not present

## 2022-09-10 DIAGNOSIS — M6281 Muscle weakness (generalized): Secondary | ICD-10-CM | POA: Diagnosis not present

## 2022-09-10 DIAGNOSIS — F0390 Unspecified dementia without behavioral disturbance: Secondary | ICD-10-CM | POA: Diagnosis not present

## 2022-09-10 DIAGNOSIS — R1312 Dysphagia, oropharyngeal phase: Secondary | ICD-10-CM | POA: Diagnosis not present

## 2022-09-11 DIAGNOSIS — R1312 Dysphagia, oropharyngeal phase: Secondary | ICD-10-CM | POA: Diagnosis not present

## 2022-09-11 DIAGNOSIS — R41841 Cognitive communication deficit: Secondary | ICD-10-CM | POA: Diagnosis not present

## 2022-09-11 DIAGNOSIS — S22060D Wedge compression fracture of T7-T8 vertebra, subsequent encounter for fracture with routine healing: Secondary | ICD-10-CM | POA: Diagnosis not present

## 2022-09-11 DIAGNOSIS — F0390 Unspecified dementia without behavioral disturbance: Secondary | ICD-10-CM | POA: Diagnosis not present

## 2022-09-11 DIAGNOSIS — M6281 Muscle weakness (generalized): Secondary | ICD-10-CM | POA: Diagnosis not present

## 2022-09-12 DIAGNOSIS — M6281 Muscle weakness (generalized): Secondary | ICD-10-CM | POA: Diagnosis not present

## 2022-09-12 DIAGNOSIS — S22060D Wedge compression fracture of T7-T8 vertebra, subsequent encounter for fracture with routine healing: Secondary | ICD-10-CM | POA: Diagnosis not present

## 2022-09-12 DIAGNOSIS — R1312 Dysphagia, oropharyngeal phase: Secondary | ICD-10-CM | POA: Diagnosis not present

## 2022-09-12 DIAGNOSIS — F0390 Unspecified dementia without behavioral disturbance: Secondary | ICD-10-CM | POA: Diagnosis not present

## 2022-09-12 DIAGNOSIS — R41841 Cognitive communication deficit: Secondary | ICD-10-CM | POA: Diagnosis not present

## 2022-09-15 DIAGNOSIS — M6281 Muscle weakness (generalized): Secondary | ICD-10-CM | POA: Diagnosis not present

## 2022-09-15 DIAGNOSIS — R531 Weakness: Secondary | ICD-10-CM | POA: Diagnosis not present

## 2022-09-15 DIAGNOSIS — R1312 Dysphagia, oropharyngeal phase: Secondary | ICD-10-CM | POA: Diagnosis not present

## 2022-09-15 DIAGNOSIS — F411 Generalized anxiety disorder: Secondary | ICD-10-CM | POA: Diagnosis not present

## 2022-09-15 DIAGNOSIS — J029 Acute pharyngitis, unspecified: Secondary | ICD-10-CM | POA: Diagnosis not present

## 2022-09-15 DIAGNOSIS — F0390 Unspecified dementia without behavioral disturbance: Secondary | ICD-10-CM | POA: Diagnosis not present

## 2022-09-15 DIAGNOSIS — S22060D Wedge compression fracture of T7-T8 vertebra, subsequent encounter for fracture with routine healing: Secondary | ICD-10-CM | POA: Diagnosis not present

## 2022-09-15 DIAGNOSIS — R41841 Cognitive communication deficit: Secondary | ICD-10-CM | POA: Diagnosis not present

## 2022-09-16 DIAGNOSIS — R1312 Dysphagia, oropharyngeal phase: Secondary | ICD-10-CM | POA: Diagnosis not present

## 2022-09-16 DIAGNOSIS — N390 Urinary tract infection, site not specified: Secondary | ICD-10-CM | POA: Diagnosis not present

## 2022-09-16 DIAGNOSIS — R41841 Cognitive communication deficit: Secondary | ICD-10-CM | POA: Diagnosis not present

## 2022-09-16 DIAGNOSIS — F411 Generalized anxiety disorder: Secondary | ICD-10-CM | POA: Diagnosis not present

## 2022-09-16 DIAGNOSIS — F0390 Unspecified dementia without behavioral disturbance: Secondary | ICD-10-CM | POA: Diagnosis not present

## 2022-09-16 DIAGNOSIS — M6281 Muscle weakness (generalized): Secondary | ICD-10-CM | POA: Diagnosis not present

## 2022-09-16 DIAGNOSIS — I10 Essential (primary) hypertension: Secondary | ICD-10-CM | POA: Diagnosis not present

## 2022-09-16 DIAGNOSIS — S22060D Wedge compression fracture of T7-T8 vertebra, subsequent encounter for fracture with routine healing: Secondary | ICD-10-CM | POA: Diagnosis not present

## 2022-09-17 DIAGNOSIS — M6281 Muscle weakness (generalized): Secondary | ICD-10-CM | POA: Diagnosis not present

## 2022-09-17 DIAGNOSIS — F0390 Unspecified dementia without behavioral disturbance: Secondary | ICD-10-CM | POA: Diagnosis not present

## 2022-09-17 DIAGNOSIS — R1312 Dysphagia, oropharyngeal phase: Secondary | ICD-10-CM | POA: Diagnosis not present

## 2022-09-17 DIAGNOSIS — S22060D Wedge compression fracture of T7-T8 vertebra, subsequent encounter for fracture with routine healing: Secondary | ICD-10-CM | POA: Diagnosis not present

## 2022-09-17 DIAGNOSIS — R41841 Cognitive communication deficit: Secondary | ICD-10-CM | POA: Diagnosis not present

## 2022-09-18 DIAGNOSIS — D696 Thrombocytopenia, unspecified: Secondary | ICD-10-CM | POA: Diagnosis not present

## 2022-09-18 DIAGNOSIS — E441 Mild protein-calorie malnutrition: Secondary | ICD-10-CM | POA: Diagnosis not present

## 2022-09-18 DIAGNOSIS — R627 Adult failure to thrive: Secondary | ICD-10-CM | POA: Diagnosis not present

## 2022-09-18 DIAGNOSIS — S22060D Wedge compression fracture of T7-T8 vertebra, subsequent encounter for fracture with routine healing: Secondary | ICD-10-CM | POA: Diagnosis not present

## 2022-09-18 DIAGNOSIS — F0390 Unspecified dementia without behavioral disturbance: Secondary | ICD-10-CM | POA: Diagnosis not present

## 2022-09-18 DIAGNOSIS — E559 Vitamin D deficiency, unspecified: Secondary | ICD-10-CM | POA: Diagnosis not present

## 2022-09-18 DIAGNOSIS — M6281 Muscle weakness (generalized): Secondary | ICD-10-CM | POA: Diagnosis not present

## 2022-09-18 DIAGNOSIS — E039 Hypothyroidism, unspecified: Secondary | ICD-10-CM | POA: Diagnosis not present

## 2022-09-18 DIAGNOSIS — F411 Generalized anxiety disorder: Secondary | ICD-10-CM | POA: Diagnosis not present

## 2022-09-18 DIAGNOSIS — R1312 Dysphagia, oropharyngeal phase: Secondary | ICD-10-CM | POA: Diagnosis not present

## 2022-09-18 DIAGNOSIS — R41841 Cognitive communication deficit: Secondary | ICD-10-CM | POA: Diagnosis not present

## 2022-09-19 DIAGNOSIS — R1312 Dysphagia, oropharyngeal phase: Secondary | ICD-10-CM | POA: Diagnosis not present

## 2022-09-19 DIAGNOSIS — F0390 Unspecified dementia without behavioral disturbance: Secondary | ICD-10-CM | POA: Diagnosis not present

## 2022-09-19 DIAGNOSIS — M6281 Muscle weakness (generalized): Secondary | ICD-10-CM | POA: Diagnosis not present

## 2022-09-19 DIAGNOSIS — R41841 Cognitive communication deficit: Secondary | ICD-10-CM | POA: Diagnosis not present

## 2022-09-19 DIAGNOSIS — S22060D Wedge compression fracture of T7-T8 vertebra, subsequent encounter for fracture with routine healing: Secondary | ICD-10-CM | POA: Diagnosis not present

## 2022-09-22 DIAGNOSIS — S22060D Wedge compression fracture of T7-T8 vertebra, subsequent encounter for fracture with routine healing: Secondary | ICD-10-CM | POA: Diagnosis not present

## 2022-09-22 DIAGNOSIS — M6281 Muscle weakness (generalized): Secondary | ICD-10-CM | POA: Diagnosis not present

## 2022-09-22 DIAGNOSIS — R1312 Dysphagia, oropharyngeal phase: Secondary | ICD-10-CM | POA: Diagnosis not present

## 2022-09-22 DIAGNOSIS — F0390 Unspecified dementia without behavioral disturbance: Secondary | ICD-10-CM | POA: Diagnosis not present

## 2022-09-22 DIAGNOSIS — R41841 Cognitive communication deficit: Secondary | ICD-10-CM | POA: Diagnosis not present

## 2022-09-23 DIAGNOSIS — R1312 Dysphagia, oropharyngeal phase: Secondary | ICD-10-CM | POA: Diagnosis not present

## 2022-09-23 DIAGNOSIS — M6281 Muscle weakness (generalized): Secondary | ICD-10-CM | POA: Diagnosis not present

## 2022-09-23 DIAGNOSIS — S22060D Wedge compression fracture of T7-T8 vertebra, subsequent encounter for fracture with routine healing: Secondary | ICD-10-CM | POA: Diagnosis not present

## 2022-09-23 DIAGNOSIS — F0390 Unspecified dementia without behavioral disturbance: Secondary | ICD-10-CM | POA: Diagnosis not present

## 2022-09-23 DIAGNOSIS — R41841 Cognitive communication deficit: Secondary | ICD-10-CM | POA: Diagnosis not present

## 2022-09-24 DIAGNOSIS — R1312 Dysphagia, oropharyngeal phase: Secondary | ICD-10-CM | POA: Diagnosis not present

## 2022-09-24 DIAGNOSIS — F0390 Unspecified dementia without behavioral disturbance: Secondary | ICD-10-CM | POA: Diagnosis not present

## 2022-09-24 DIAGNOSIS — S22060D Wedge compression fracture of T7-T8 vertebra, subsequent encounter for fracture with routine healing: Secondary | ICD-10-CM | POA: Diagnosis not present

## 2022-09-24 DIAGNOSIS — M6281 Muscle weakness (generalized): Secondary | ICD-10-CM | POA: Diagnosis not present

## 2022-09-24 DIAGNOSIS — R41841 Cognitive communication deficit: Secondary | ICD-10-CM | POA: Diagnosis not present

## 2022-09-25 DIAGNOSIS — M6281 Muscle weakness (generalized): Secondary | ICD-10-CM | POA: Diagnosis not present

## 2022-09-25 DIAGNOSIS — R1312 Dysphagia, oropharyngeal phase: Secondary | ICD-10-CM | POA: Diagnosis not present

## 2022-09-25 DIAGNOSIS — R41841 Cognitive communication deficit: Secondary | ICD-10-CM | POA: Diagnosis not present

## 2022-09-25 DIAGNOSIS — S22060D Wedge compression fracture of T7-T8 vertebra, subsequent encounter for fracture with routine healing: Secondary | ICD-10-CM | POA: Diagnosis not present

## 2022-09-25 DIAGNOSIS — F0390 Unspecified dementia without behavioral disturbance: Secondary | ICD-10-CM | POA: Diagnosis not present

## 2022-09-26 DIAGNOSIS — F0390 Unspecified dementia without behavioral disturbance: Secondary | ICD-10-CM | POA: Diagnosis not present

## 2022-09-26 DIAGNOSIS — M6281 Muscle weakness (generalized): Secondary | ICD-10-CM | POA: Diagnosis not present

## 2022-09-26 DIAGNOSIS — R41841 Cognitive communication deficit: Secondary | ICD-10-CM | POA: Diagnosis not present

## 2022-09-26 DIAGNOSIS — R1312 Dysphagia, oropharyngeal phase: Secondary | ICD-10-CM | POA: Diagnosis not present

## 2022-09-26 DIAGNOSIS — S22060D Wedge compression fracture of T7-T8 vertebra, subsequent encounter for fracture with routine healing: Secondary | ICD-10-CM | POA: Diagnosis not present

## 2022-09-27 DIAGNOSIS — S22060D Wedge compression fracture of T7-T8 vertebra, subsequent encounter for fracture with routine healing: Secondary | ICD-10-CM | POA: Diagnosis not present

## 2022-09-27 DIAGNOSIS — M6281 Muscle weakness (generalized): Secondary | ICD-10-CM | POA: Diagnosis not present

## 2022-09-27 DIAGNOSIS — F0390 Unspecified dementia without behavioral disturbance: Secondary | ICD-10-CM | POA: Diagnosis not present

## 2022-09-27 DIAGNOSIS — R41841 Cognitive communication deficit: Secondary | ICD-10-CM | POA: Diagnosis not present

## 2022-09-27 DIAGNOSIS — R1312 Dysphagia, oropharyngeal phase: Secondary | ICD-10-CM | POA: Diagnosis not present

## 2022-09-29 DIAGNOSIS — S22060D Wedge compression fracture of T7-T8 vertebra, subsequent encounter for fracture with routine healing: Secondary | ICD-10-CM | POA: Diagnosis not present

## 2022-09-29 DIAGNOSIS — R41841 Cognitive communication deficit: Secondary | ICD-10-CM | POA: Diagnosis not present

## 2022-09-29 DIAGNOSIS — F0390 Unspecified dementia without behavioral disturbance: Secondary | ICD-10-CM | POA: Diagnosis not present

## 2022-09-29 DIAGNOSIS — R1312 Dysphagia, oropharyngeal phase: Secondary | ICD-10-CM | POA: Diagnosis not present

## 2022-09-29 DIAGNOSIS — M6281 Muscle weakness (generalized): Secondary | ICD-10-CM | POA: Diagnosis not present

## 2022-09-30 DIAGNOSIS — M6281 Muscle weakness (generalized): Secondary | ICD-10-CM | POA: Diagnosis not present

## 2022-09-30 DIAGNOSIS — S22060D Wedge compression fracture of T7-T8 vertebra, subsequent encounter for fracture with routine healing: Secondary | ICD-10-CM | POA: Diagnosis not present

## 2022-09-30 DIAGNOSIS — F0390 Unspecified dementia without behavioral disturbance: Secondary | ICD-10-CM | POA: Diagnosis not present

## 2022-09-30 DIAGNOSIS — R41841 Cognitive communication deficit: Secondary | ICD-10-CM | POA: Diagnosis not present

## 2022-09-30 DIAGNOSIS — R1312 Dysphagia, oropharyngeal phase: Secondary | ICD-10-CM | POA: Diagnosis not present

## 2022-10-01 DIAGNOSIS — S22060D Wedge compression fracture of T7-T8 vertebra, subsequent encounter for fracture with routine healing: Secondary | ICD-10-CM | POA: Diagnosis not present

## 2022-10-01 DIAGNOSIS — F0390 Unspecified dementia without behavioral disturbance: Secondary | ICD-10-CM | POA: Diagnosis not present

## 2022-10-01 DIAGNOSIS — R41841 Cognitive communication deficit: Secondary | ICD-10-CM | POA: Diagnosis not present

## 2022-10-01 DIAGNOSIS — R1312 Dysphagia, oropharyngeal phase: Secondary | ICD-10-CM | POA: Diagnosis not present

## 2022-10-01 DIAGNOSIS — M6281 Muscle weakness (generalized): Secondary | ICD-10-CM | POA: Diagnosis not present

## 2022-10-02 DIAGNOSIS — F0390 Unspecified dementia without behavioral disturbance: Secondary | ICD-10-CM | POA: Diagnosis not present

## 2022-10-02 DIAGNOSIS — R1312 Dysphagia, oropharyngeal phase: Secondary | ICD-10-CM | POA: Diagnosis not present

## 2022-10-02 DIAGNOSIS — M6281 Muscle weakness (generalized): Secondary | ICD-10-CM | POA: Diagnosis not present

## 2022-10-02 DIAGNOSIS — S22060D Wedge compression fracture of T7-T8 vertebra, subsequent encounter for fracture with routine healing: Secondary | ICD-10-CM | POA: Diagnosis not present

## 2022-10-02 DIAGNOSIS — R41841 Cognitive communication deficit: Secondary | ICD-10-CM | POA: Diagnosis not present

## 2022-10-03 DIAGNOSIS — M6281 Muscle weakness (generalized): Secondary | ICD-10-CM | POA: Diagnosis not present

## 2022-10-03 DIAGNOSIS — R1312 Dysphagia, oropharyngeal phase: Secondary | ICD-10-CM | POA: Diagnosis not present

## 2022-10-03 DIAGNOSIS — S22060D Wedge compression fracture of T7-T8 vertebra, subsequent encounter for fracture with routine healing: Secondary | ICD-10-CM | POA: Diagnosis not present

## 2022-10-03 DIAGNOSIS — F0390 Unspecified dementia without behavioral disturbance: Secondary | ICD-10-CM | POA: Diagnosis not present

## 2022-10-03 DIAGNOSIS — R41841 Cognitive communication deficit: Secondary | ICD-10-CM | POA: Diagnosis not present

## 2022-10-04 DIAGNOSIS — R41841 Cognitive communication deficit: Secondary | ICD-10-CM | POA: Diagnosis not present

## 2022-10-04 DIAGNOSIS — R1312 Dysphagia, oropharyngeal phase: Secondary | ICD-10-CM | POA: Diagnosis not present

## 2022-10-04 DIAGNOSIS — F0390 Unspecified dementia without behavioral disturbance: Secondary | ICD-10-CM | POA: Diagnosis not present

## 2022-10-04 DIAGNOSIS — S22060D Wedge compression fracture of T7-T8 vertebra, subsequent encounter for fracture with routine healing: Secondary | ICD-10-CM | POA: Diagnosis not present

## 2022-10-04 DIAGNOSIS — M6281 Muscle weakness (generalized): Secondary | ICD-10-CM | POA: Diagnosis not present

## 2022-10-05 DIAGNOSIS — M6281 Muscle weakness (generalized): Secondary | ICD-10-CM | POA: Diagnosis not present

## 2022-10-05 DIAGNOSIS — R41841 Cognitive communication deficit: Secondary | ICD-10-CM | POA: Diagnosis not present

## 2022-10-05 DIAGNOSIS — F0390 Unspecified dementia without behavioral disturbance: Secondary | ICD-10-CM | POA: Diagnosis not present

## 2022-10-05 DIAGNOSIS — S22060D Wedge compression fracture of T7-T8 vertebra, subsequent encounter for fracture with routine healing: Secondary | ICD-10-CM | POA: Diagnosis not present

## 2022-10-05 DIAGNOSIS — R1312 Dysphagia, oropharyngeal phase: Secondary | ICD-10-CM | POA: Diagnosis not present

## 2022-10-06 DIAGNOSIS — R41841 Cognitive communication deficit: Secondary | ICD-10-CM | POA: Diagnosis not present

## 2022-10-06 DIAGNOSIS — R1312 Dysphagia, oropharyngeal phase: Secondary | ICD-10-CM | POA: Diagnosis not present

## 2022-10-06 DIAGNOSIS — F0390 Unspecified dementia without behavioral disturbance: Secondary | ICD-10-CM | POA: Diagnosis not present

## 2022-10-06 DIAGNOSIS — M6281 Muscle weakness (generalized): Secondary | ICD-10-CM | POA: Diagnosis not present

## 2022-10-06 DIAGNOSIS — S22060D Wedge compression fracture of T7-T8 vertebra, subsequent encounter for fracture with routine healing: Secondary | ICD-10-CM | POA: Diagnosis not present

## 2022-10-07 DIAGNOSIS — R41841 Cognitive communication deficit: Secondary | ICD-10-CM | POA: Diagnosis not present

## 2022-10-07 DIAGNOSIS — F0390 Unspecified dementia without behavioral disturbance: Secondary | ICD-10-CM | POA: Diagnosis not present

## 2022-10-07 DIAGNOSIS — S22060D Wedge compression fracture of T7-T8 vertebra, subsequent encounter for fracture with routine healing: Secondary | ICD-10-CM | POA: Diagnosis not present

## 2022-10-07 DIAGNOSIS — R1312 Dysphagia, oropharyngeal phase: Secondary | ICD-10-CM | POA: Diagnosis not present

## 2022-10-07 DIAGNOSIS — M6281 Muscle weakness (generalized): Secondary | ICD-10-CM | POA: Diagnosis not present

## 2022-10-08 DIAGNOSIS — M6281 Muscle weakness (generalized): Secondary | ICD-10-CM | POA: Diagnosis not present

## 2022-10-08 DIAGNOSIS — R41841 Cognitive communication deficit: Secondary | ICD-10-CM | POA: Diagnosis not present

## 2022-10-08 DIAGNOSIS — F0390 Unspecified dementia without behavioral disturbance: Secondary | ICD-10-CM | POA: Diagnosis not present

## 2022-10-08 DIAGNOSIS — R1312 Dysphagia, oropharyngeal phase: Secondary | ICD-10-CM | POA: Diagnosis not present

## 2022-10-08 DIAGNOSIS — S22060D Wedge compression fracture of T7-T8 vertebra, subsequent encounter for fracture with routine healing: Secondary | ICD-10-CM | POA: Diagnosis not present

## 2022-10-09 DIAGNOSIS — F0390 Unspecified dementia without behavioral disturbance: Secondary | ICD-10-CM | POA: Diagnosis not present

## 2022-10-09 DIAGNOSIS — R41841 Cognitive communication deficit: Secondary | ICD-10-CM | POA: Diagnosis not present

## 2022-10-09 DIAGNOSIS — M6281 Muscle weakness (generalized): Secondary | ICD-10-CM | POA: Diagnosis not present

## 2022-10-09 DIAGNOSIS — S22060D Wedge compression fracture of T7-T8 vertebra, subsequent encounter for fracture with routine healing: Secondary | ICD-10-CM | POA: Diagnosis not present

## 2022-10-09 DIAGNOSIS — R1312 Dysphagia, oropharyngeal phase: Secondary | ICD-10-CM | POA: Diagnosis not present

## 2022-10-10 DIAGNOSIS — S22060D Wedge compression fracture of T7-T8 vertebra, subsequent encounter for fracture with routine healing: Secondary | ICD-10-CM | POA: Diagnosis not present

## 2022-10-10 DIAGNOSIS — R1312 Dysphagia, oropharyngeal phase: Secondary | ICD-10-CM | POA: Diagnosis not present

## 2022-10-10 DIAGNOSIS — R41841 Cognitive communication deficit: Secondary | ICD-10-CM | POA: Diagnosis not present

## 2022-10-10 DIAGNOSIS — F0390 Unspecified dementia without behavioral disturbance: Secondary | ICD-10-CM | POA: Diagnosis not present

## 2022-10-10 DIAGNOSIS — M6281 Muscle weakness (generalized): Secondary | ICD-10-CM | POA: Diagnosis not present

## 2022-10-13 DIAGNOSIS — R1312 Dysphagia, oropharyngeal phase: Secondary | ICD-10-CM | POA: Diagnosis not present

## 2022-10-13 DIAGNOSIS — R41841 Cognitive communication deficit: Secondary | ICD-10-CM | POA: Diagnosis not present

## 2022-10-13 DIAGNOSIS — M6281 Muscle weakness (generalized): Secondary | ICD-10-CM | POA: Diagnosis not present

## 2022-10-13 DIAGNOSIS — S22060D Wedge compression fracture of T7-T8 vertebra, subsequent encounter for fracture with routine healing: Secondary | ICD-10-CM | POA: Diagnosis not present

## 2022-10-13 DIAGNOSIS — F0390 Unspecified dementia without behavioral disturbance: Secondary | ICD-10-CM | POA: Diagnosis not present

## 2022-10-14 DIAGNOSIS — S22060D Wedge compression fracture of T7-T8 vertebra, subsequent encounter for fracture with routine healing: Secondary | ICD-10-CM | POA: Diagnosis not present

## 2022-10-14 DIAGNOSIS — R1312 Dysphagia, oropharyngeal phase: Secondary | ICD-10-CM | POA: Diagnosis not present

## 2022-10-14 DIAGNOSIS — F0390 Unspecified dementia without behavioral disturbance: Secondary | ICD-10-CM | POA: Diagnosis not present

## 2022-10-14 DIAGNOSIS — M6281 Muscle weakness (generalized): Secondary | ICD-10-CM | POA: Diagnosis not present

## 2022-10-14 DIAGNOSIS — R41841 Cognitive communication deficit: Secondary | ICD-10-CM | POA: Diagnosis not present

## 2022-10-15 DIAGNOSIS — M6281 Muscle weakness (generalized): Secondary | ICD-10-CM | POA: Diagnosis not present

## 2022-10-15 DIAGNOSIS — S22060D Wedge compression fracture of T7-T8 vertebra, subsequent encounter for fracture with routine healing: Secondary | ICD-10-CM | POA: Diagnosis not present

## 2022-10-15 DIAGNOSIS — F0390 Unspecified dementia without behavioral disturbance: Secondary | ICD-10-CM | POA: Diagnosis not present

## 2022-10-15 DIAGNOSIS — R1312 Dysphagia, oropharyngeal phase: Secondary | ICD-10-CM | POA: Diagnosis not present

## 2022-10-15 DIAGNOSIS — R41841 Cognitive communication deficit: Secondary | ICD-10-CM | POA: Diagnosis not present

## 2022-10-16 DIAGNOSIS — R41841 Cognitive communication deficit: Secondary | ICD-10-CM | POA: Diagnosis not present

## 2022-10-16 DIAGNOSIS — F0390 Unspecified dementia without behavioral disturbance: Secondary | ICD-10-CM | POA: Diagnosis not present

## 2022-10-16 DIAGNOSIS — R1312 Dysphagia, oropharyngeal phase: Secondary | ICD-10-CM | POA: Diagnosis not present

## 2022-10-16 DIAGNOSIS — S22060D Wedge compression fracture of T7-T8 vertebra, subsequent encounter for fracture with routine healing: Secondary | ICD-10-CM | POA: Diagnosis not present

## 2022-10-16 DIAGNOSIS — M6281 Muscle weakness (generalized): Secondary | ICD-10-CM | POA: Diagnosis not present

## 2022-10-17 DIAGNOSIS — M6281 Muscle weakness (generalized): Secondary | ICD-10-CM | POA: Diagnosis not present

## 2022-10-17 DIAGNOSIS — R41841 Cognitive communication deficit: Secondary | ICD-10-CM | POA: Diagnosis not present

## 2022-10-17 DIAGNOSIS — F0390 Unspecified dementia without behavioral disturbance: Secondary | ICD-10-CM | POA: Diagnosis not present

## 2022-10-17 DIAGNOSIS — S22060D Wedge compression fracture of T7-T8 vertebra, subsequent encounter for fracture with routine healing: Secondary | ICD-10-CM | POA: Diagnosis not present

## 2022-10-17 DIAGNOSIS — R1312 Dysphagia, oropharyngeal phase: Secondary | ICD-10-CM | POA: Diagnosis not present

## 2022-10-18 DIAGNOSIS — R41841 Cognitive communication deficit: Secondary | ICD-10-CM | POA: Diagnosis not present

## 2022-10-18 DIAGNOSIS — M6281 Muscle weakness (generalized): Secondary | ICD-10-CM | POA: Diagnosis not present

## 2022-10-18 DIAGNOSIS — S22060D Wedge compression fracture of T7-T8 vertebra, subsequent encounter for fracture with routine healing: Secondary | ICD-10-CM | POA: Diagnosis not present

## 2022-10-18 DIAGNOSIS — R1312 Dysphagia, oropharyngeal phase: Secondary | ICD-10-CM | POA: Diagnosis not present

## 2022-10-18 DIAGNOSIS — F0390 Unspecified dementia without behavioral disturbance: Secondary | ICD-10-CM | POA: Diagnosis not present

## 2022-10-19 DIAGNOSIS — F0390 Unspecified dementia without behavioral disturbance: Secondary | ICD-10-CM | POA: Diagnosis not present

## 2022-10-19 DIAGNOSIS — S22060D Wedge compression fracture of T7-T8 vertebra, subsequent encounter for fracture with routine healing: Secondary | ICD-10-CM | POA: Diagnosis not present

## 2022-10-19 DIAGNOSIS — R41841 Cognitive communication deficit: Secondary | ICD-10-CM | POA: Diagnosis not present

## 2022-10-19 DIAGNOSIS — R1312 Dysphagia, oropharyngeal phase: Secondary | ICD-10-CM | POA: Diagnosis not present

## 2022-10-19 DIAGNOSIS — M6281 Muscle weakness (generalized): Secondary | ICD-10-CM | POA: Diagnosis not present

## 2022-10-20 DIAGNOSIS — M6281 Muscle weakness (generalized): Secondary | ICD-10-CM | POA: Diagnosis not present

## 2022-10-20 DIAGNOSIS — F0390 Unspecified dementia without behavioral disturbance: Secondary | ICD-10-CM | POA: Diagnosis not present

## 2022-10-20 DIAGNOSIS — R41841 Cognitive communication deficit: Secondary | ICD-10-CM | POA: Diagnosis not present

## 2022-10-20 DIAGNOSIS — R1312 Dysphagia, oropharyngeal phase: Secondary | ICD-10-CM | POA: Diagnosis not present

## 2022-10-20 DIAGNOSIS — S22060D Wedge compression fracture of T7-T8 vertebra, subsequent encounter for fracture with routine healing: Secondary | ICD-10-CM | POA: Diagnosis not present

## 2022-10-21 DIAGNOSIS — M6281 Muscle weakness (generalized): Secondary | ICD-10-CM | POA: Diagnosis not present

## 2022-10-21 DIAGNOSIS — S22060D Wedge compression fracture of T7-T8 vertebra, subsequent encounter for fracture with routine healing: Secondary | ICD-10-CM | POA: Diagnosis not present

## 2022-10-21 DIAGNOSIS — F0390 Unspecified dementia without behavioral disturbance: Secondary | ICD-10-CM | POA: Diagnosis not present

## 2022-10-21 DIAGNOSIS — R1312 Dysphagia, oropharyngeal phase: Secondary | ICD-10-CM | POA: Diagnosis not present

## 2022-10-21 DIAGNOSIS — R41841 Cognitive communication deficit: Secondary | ICD-10-CM | POA: Diagnosis not present

## 2022-10-22 DIAGNOSIS — M6281 Muscle weakness (generalized): Secondary | ICD-10-CM | POA: Diagnosis not present

## 2022-10-22 DIAGNOSIS — F0390 Unspecified dementia without behavioral disturbance: Secondary | ICD-10-CM | POA: Diagnosis not present

## 2022-10-22 DIAGNOSIS — S22060D Wedge compression fracture of T7-T8 vertebra, subsequent encounter for fracture with routine healing: Secondary | ICD-10-CM | POA: Diagnosis not present

## 2022-10-22 DIAGNOSIS — R1312 Dysphagia, oropharyngeal phase: Secondary | ICD-10-CM | POA: Diagnosis not present

## 2022-10-22 DIAGNOSIS — R41841 Cognitive communication deficit: Secondary | ICD-10-CM | POA: Diagnosis not present

## 2022-10-23 DIAGNOSIS — R1312 Dysphagia, oropharyngeal phase: Secondary | ICD-10-CM | POA: Diagnosis not present

## 2022-10-23 DIAGNOSIS — S22060D Wedge compression fracture of T7-T8 vertebra, subsequent encounter for fracture with routine healing: Secondary | ICD-10-CM | POA: Diagnosis not present

## 2022-10-23 DIAGNOSIS — R41841 Cognitive communication deficit: Secondary | ICD-10-CM | POA: Diagnosis not present

## 2022-10-23 DIAGNOSIS — F0390 Unspecified dementia without behavioral disturbance: Secondary | ICD-10-CM | POA: Diagnosis not present

## 2022-10-23 DIAGNOSIS — M6281 Muscle weakness (generalized): Secondary | ICD-10-CM | POA: Diagnosis not present

## 2022-10-24 DIAGNOSIS — R1312 Dysphagia, oropharyngeal phase: Secondary | ICD-10-CM | POA: Diagnosis not present

## 2022-10-24 DIAGNOSIS — M6281 Muscle weakness (generalized): Secondary | ICD-10-CM | POA: Diagnosis not present

## 2022-10-24 DIAGNOSIS — F0390 Unspecified dementia without behavioral disturbance: Secondary | ICD-10-CM | POA: Diagnosis not present

## 2022-10-24 DIAGNOSIS — R41841 Cognitive communication deficit: Secondary | ICD-10-CM | POA: Diagnosis not present

## 2022-10-24 DIAGNOSIS — S22060D Wedge compression fracture of T7-T8 vertebra, subsequent encounter for fracture with routine healing: Secondary | ICD-10-CM | POA: Diagnosis not present

## 2022-10-25 DIAGNOSIS — M6281 Muscle weakness (generalized): Secondary | ICD-10-CM | POA: Diagnosis not present

## 2022-10-25 DIAGNOSIS — R41841 Cognitive communication deficit: Secondary | ICD-10-CM | POA: Diagnosis not present

## 2022-10-25 DIAGNOSIS — F0390 Unspecified dementia without behavioral disturbance: Secondary | ICD-10-CM | POA: Diagnosis not present

## 2022-10-25 DIAGNOSIS — S22060D Wedge compression fracture of T7-T8 vertebra, subsequent encounter for fracture with routine healing: Secondary | ICD-10-CM | POA: Diagnosis not present

## 2022-10-25 DIAGNOSIS — R1312 Dysphagia, oropharyngeal phase: Secondary | ICD-10-CM | POA: Diagnosis not present

## 2022-10-27 DIAGNOSIS — F0390 Unspecified dementia without behavioral disturbance: Secondary | ICD-10-CM | POA: Diagnosis not present

## 2022-10-27 DIAGNOSIS — M6281 Muscle weakness (generalized): Secondary | ICD-10-CM | POA: Diagnosis not present

## 2022-10-27 DIAGNOSIS — S22060D Wedge compression fracture of T7-T8 vertebra, subsequent encounter for fracture with routine healing: Secondary | ICD-10-CM | POA: Diagnosis not present

## 2022-10-27 DIAGNOSIS — R41841 Cognitive communication deficit: Secondary | ICD-10-CM | POA: Diagnosis not present

## 2022-10-27 DIAGNOSIS — R1312 Dysphagia, oropharyngeal phase: Secondary | ICD-10-CM | POA: Diagnosis not present

## 2022-10-28 DIAGNOSIS — M6281 Muscle weakness (generalized): Secondary | ICD-10-CM | POA: Diagnosis not present

## 2022-10-28 DIAGNOSIS — R1312 Dysphagia, oropharyngeal phase: Secondary | ICD-10-CM | POA: Diagnosis not present

## 2022-10-28 DIAGNOSIS — R41841 Cognitive communication deficit: Secondary | ICD-10-CM | POA: Diagnosis not present

## 2022-10-28 DIAGNOSIS — F0390 Unspecified dementia without behavioral disturbance: Secondary | ICD-10-CM | POA: Diagnosis not present

## 2022-10-28 DIAGNOSIS — S22060D Wedge compression fracture of T7-T8 vertebra, subsequent encounter for fracture with routine healing: Secondary | ICD-10-CM | POA: Diagnosis not present

## 2022-10-29 DIAGNOSIS — S22060D Wedge compression fracture of T7-T8 vertebra, subsequent encounter for fracture with routine healing: Secondary | ICD-10-CM | POA: Diagnosis not present

## 2022-10-29 DIAGNOSIS — R41841 Cognitive communication deficit: Secondary | ICD-10-CM | POA: Diagnosis not present

## 2022-10-29 DIAGNOSIS — F0390 Unspecified dementia without behavioral disturbance: Secondary | ICD-10-CM | POA: Diagnosis not present

## 2022-10-29 DIAGNOSIS — M6281 Muscle weakness (generalized): Secondary | ICD-10-CM | POA: Diagnosis not present

## 2022-10-29 DIAGNOSIS — R1312 Dysphagia, oropharyngeal phase: Secondary | ICD-10-CM | POA: Diagnosis not present

## 2022-10-30 DIAGNOSIS — R1312 Dysphagia, oropharyngeal phase: Secondary | ICD-10-CM | POA: Diagnosis not present

## 2022-10-30 DIAGNOSIS — S22060D Wedge compression fracture of T7-T8 vertebra, subsequent encounter for fracture with routine healing: Secondary | ICD-10-CM | POA: Diagnosis not present

## 2022-10-30 DIAGNOSIS — R41841 Cognitive communication deficit: Secondary | ICD-10-CM | POA: Diagnosis not present

## 2022-10-30 DIAGNOSIS — F0390 Unspecified dementia without behavioral disturbance: Secondary | ICD-10-CM | POA: Diagnosis not present

## 2022-10-30 DIAGNOSIS — M6281 Muscle weakness (generalized): Secondary | ICD-10-CM | POA: Diagnosis not present

## 2022-10-31 DIAGNOSIS — R1312 Dysphagia, oropharyngeal phase: Secondary | ICD-10-CM | POA: Diagnosis not present

## 2022-10-31 DIAGNOSIS — R41841 Cognitive communication deficit: Secondary | ICD-10-CM | POA: Diagnosis not present

## 2022-10-31 DIAGNOSIS — S22060D Wedge compression fracture of T7-T8 vertebra, subsequent encounter for fracture with routine healing: Secondary | ICD-10-CM | POA: Diagnosis not present

## 2022-10-31 DIAGNOSIS — F0390 Unspecified dementia without behavioral disturbance: Secondary | ICD-10-CM | POA: Diagnosis not present

## 2022-10-31 DIAGNOSIS — M6281 Muscle weakness (generalized): Secondary | ICD-10-CM | POA: Diagnosis not present

## 2022-11-03 DIAGNOSIS — M6281 Muscle weakness (generalized): Secondary | ICD-10-CM | POA: Diagnosis not present

## 2022-11-03 DIAGNOSIS — R41841 Cognitive communication deficit: Secondary | ICD-10-CM | POA: Diagnosis not present

## 2022-11-03 DIAGNOSIS — F0390 Unspecified dementia without behavioral disturbance: Secondary | ICD-10-CM | POA: Diagnosis not present

## 2022-11-03 DIAGNOSIS — R1312 Dysphagia, oropharyngeal phase: Secondary | ICD-10-CM | POA: Diagnosis not present

## 2022-11-03 DIAGNOSIS — S22060D Wedge compression fracture of T7-T8 vertebra, subsequent encounter for fracture with routine healing: Secondary | ICD-10-CM | POA: Diagnosis not present
